# Patient Record
Sex: Female | Born: 1990 | Race: Black or African American | Hispanic: No | Marital: Single | Smoking: Current every day smoker
Health system: Southern US, Community
[De-identification: ages and names within clinical notes are randomized; demographics above are authoritative.]

## PROBLEM LIST (undated history)

## (undated) ENCOUNTER — Inpatient Hospital Stay (HOSPITAL_COMMUNITY): Payer: Self-pay

## (undated) DIAGNOSIS — Z789 Other specified health status: Secondary | ICD-10-CM

---

## 2010-01-03 HISTORY — PX: CHOLECYSTECTOMY: SHX55

## 2015-07-12 ENCOUNTER — Emergency Department (HOSPITAL_COMMUNITY)
Admission: EM | Admit: 2015-07-12 | Discharge: 2015-07-12 | Disposition: A | Discharge status: Home / Self Care | Payer: Medicaid Other | Attending: Emergency Medicine | Admitting: Emergency Medicine

## 2015-07-12 ENCOUNTER — Emergency Department (HOSPITAL_COMMUNITY): Payer: Medicaid Other

## 2015-07-12 ENCOUNTER — Encounter (HOSPITAL_COMMUNITY): Payer: Self-pay | Admitting: Emergency Medicine

## 2015-07-12 DIAGNOSIS — R1011 Right upper quadrant pain: Secondary | ICD-10-CM | POA: Insufficient documentation

## 2015-07-12 DIAGNOSIS — R1010 Upper abdominal pain, unspecified: Secondary | ICD-10-CM

## 2015-07-12 DIAGNOSIS — R1013 Epigastric pain: Secondary | ICD-10-CM | POA: Insufficient documentation

## 2015-07-12 DIAGNOSIS — F17210 Nicotine dependence, cigarettes, uncomplicated: Secondary | ICD-10-CM | POA: Insufficient documentation

## 2015-07-12 DIAGNOSIS — R112 Nausea with vomiting, unspecified: Secondary | ICD-10-CM | POA: Diagnosis not present

## 2015-07-12 LAB — CBC WITH DIFFERENTIAL/PLATELET
Basophils Absolute: 0 10*3/uL (ref 0.0–0.1)
Basophils Relative: 0 %
EOS ABS: 0.1 10*3/uL (ref 0.0–0.7)
EOS PCT: 1 %
HCT: 37 % (ref 36.0–46.0)
Hemoglobin: 13.1 g/dL (ref 12.0–15.0)
LYMPHS ABS: 2.4 10*3/uL (ref 0.7–4.0)
Lymphocytes Relative: 30 %
MCH: 30.8 pg (ref 26.0–34.0)
MCHC: 35.4 g/dL (ref 30.0–36.0)
MCV: 87.1 fL (ref 78.0–100.0)
Monocytes Absolute: 0.5 10*3/uL (ref 0.1–1.0)
Monocytes Relative: 7 %
NEUTROS ABS: 4.9 10*3/uL (ref 1.7–7.7)
NEUTROS PCT: 62 %
PLATELETS: 291 10*3/uL (ref 150–400)
RBC: 4.25 MIL/uL (ref 3.87–5.11)
RDW: 13 % (ref 11.5–15.5)
WBC: 7.9 10*3/uL (ref 4.0–10.5)

## 2015-07-12 LAB — COMPREHENSIVE METABOLIC PANEL
ALT: 11 U/L — ABNORMAL LOW (ref 14–54)
AST: 14 U/L — ABNORMAL LOW (ref 15–41)
Albumin: 4.1 g/dL (ref 3.5–5.0)
Alkaline Phosphatase: 62 U/L (ref 38–126)
Anion gap: 8 (ref 5–15)
BUN: <5 mg/dL — ABNORMAL LOW (ref 6–20)
CHLORIDE: 108 mmol/L (ref 101–111)
CO2: 22 mmol/L (ref 22–32)
Calcium: 9.4 mg/dL (ref 8.9–10.3)
Creatinine, Ser: 0.76 mg/dL (ref 0.44–1.00)
GFR calc non Af Amer: >60 mL/min (ref >60)
Glucose, Bld: 113 mg/dL — ABNORMAL HIGH (ref 65–99)
POTASSIUM: 3.1 mmol/L — AB (ref 3.5–5.1)
SODIUM: 138 mmol/L (ref 135–145)
Total Bilirubin: 0.7 mg/dL (ref 0.3–1.2)
Total Protein: 7.9 g/dL (ref 6.5–8.1)

## 2015-07-12 LAB — LIPASE, BLOOD: Lipase: 20 U/L (ref 11–51)

## 2015-07-12 LAB — I-STAT BETA HCG BLOOD, ED (MC, WL, AP ONLY)

## 2015-07-12 LAB — URINE MICROSCOPIC-ADD ON
Bacteria, UA: NONE SEEN
RBC / HPF: NONE SEEN RBC/hpf (ref 0–5)

## 2015-07-12 LAB — URINALYSIS, ROUTINE W REFLEX MICROSCOPIC
Bilirubin Urine: NEGATIVE
Glucose, UA: NEGATIVE mg/dL
Hgb urine dipstick: NEGATIVE
KETONES UR: NEGATIVE mg/dL
NITRITE: NEGATIVE
PROTEIN: NEGATIVE mg/dL
Specific Gravity, Urine: 1.025 (ref 1.005–1.030)
pH: 6 (ref 5.0–8.0)

## 2015-07-12 MED ORDER — KETOROLAC TROMETHAMINE 30 MG/ML IJ SOLN
30.0000 mg | Freq: Once | INTRAMUSCULAR | Status: AC
  Administered 2015-07-12: 30 mg via INTRAVENOUS
  Filled 2015-07-12: qty 1

## 2015-07-12 MED ORDER — HYDROMORPHONE HCL 1 MG/ML IJ SOLN
1.0000 mg | Freq: Once | INTRAMUSCULAR | Status: AC
  Administered 2015-07-12: 1 mg via INTRAMUSCULAR
  Filled 2015-07-12: qty 1

## 2015-07-12 MED ORDER — ONDANSETRON HCL 4 MG/2ML IJ SOLN
4.0000 mg | Freq: Once | INTRAMUSCULAR | Status: AC
  Administered 2015-07-12: 4 mg via INTRAVENOUS
  Filled 2015-07-12: qty 2

## 2015-07-12 MED ORDER — PROMETHAZINE HCL 25 MG PO TABS: 25.0000 mg | ORAL_TABLET | Freq: Four times a day (QID) | ORAL | Status: DC | PRN

## 2015-07-12 MED ORDER — SODIUM CHLORIDE 0.9 % IV BOLUS (SEPSIS)
1000.0000 mL | Freq: Once | INTRAVENOUS | Status: AC
  Administered 2015-07-12: 1000 mL via INTRAVENOUS

## 2015-07-12 MED ORDER — POTASSIUM CHLORIDE CRYS ER 20 MEQ PO TBCR
40.0000 meq | EXTENDED_RELEASE_TABLET | Freq: Once | ORAL | Status: AC
  Administered 2015-07-12: 40 meq via ORAL
  Filled 2015-07-12: qty 2

## 2015-07-12 MED ORDER — FAMOTIDINE IN NACL 20-0.9 MG/50ML-% IV SOLN
20.0000 mg | INTRAVENOUS | Status: AC
  Administered 2015-07-12: 20 mg via INTRAVENOUS
  Filled 2015-07-12: qty 50

## 2015-07-12 MED ORDER — OMEPRAZOLE 20 MG PO CPDR: 20.0000 mg | DELAYED_RELEASE_CAPSULE | Freq: Every day | ORAL | Status: DC

## 2015-07-12 MED ORDER — DICYCLOMINE HCL 20 MG PO TABS: 20.0000 mg | ORAL_TABLET | Freq: Two times a day (BID) | ORAL | Status: DC

## 2015-07-12 MED ORDER — IOPAMIDOL (ISOVUE-300) INJECTION 61%
100.0000 mL | Freq: Once | INTRAVENOUS | Status: AC | PRN
  Administered 2015-07-12: 100 mL via INTRAVENOUS

## 2015-07-12 NOTE — ED Provider Notes (Signed)
CSN: [id number]     Arrival date & time 07/12/15  [id number] History   First MD Initiated Contact with Patient 07/12/15 0320     Chief Complaint  Patient presents with  . Abdominal Pain  . Nausea  . Emesis     (Consider location/radiation/quality/duration/timing/severity/associated sxs/prior Treatment) HPI Comments: 25 year old female presents to the emergency department for evaluation of intermittent epigastric and right upper quadrant abdominal pain which began at 10 AM yesterday. She states that symptoms have been waxing and waning in severity. She has been unable to eat solid foods secondary to nausea and vomiting. She has been able to keep down a small amount of water. Patient has had a total of 4 episodes of nonbloody emesis. She denies the radiation of her abdominal pain and has not taken any medications for symptoms prior to arrival. She states that her pain feels similar to when she had gallstones. She reportedly [facility]. Patient denies any fevers, hematemesis, melena, hematochezia, vaginal complaints, dysuria, or hematuria.  Patient is a 25 y.o. female presenting with abdominal pain and vomiting. The history is provided by the patient. No language interpreter was used.  Abdominal Pain Associated symptoms: nausea and vomiting   Associated symptoms: no diarrhea, no dysuria, no fever, no vaginal bleeding and no vaginal discharge   Emesis Associated symptoms: abdominal pain   Associated symptoms: no diarrhea     History reviewed. No pertinent past medical history. History reviewed. No pertinent past surgical history. History reviewed. No pertinent family history. Social History  Substance Use Topics  . Smoking status: Current Every Day Smoker    Types: Cigarettes  . Smokeless tobacco: None  . Alcohol Use: No   OB History    No data available      Review of Systems  Constitutional: Negative for fever.  Gastrointestinal: Positive for nausea,  vomiting and abdominal pain. Negative for diarrhea.  Genitourinary: Negative for dysuria, vaginal bleeding and vaginal discharge.  All other systems reviewed and are negative.   Allergies  Review of patient's allergies indicates no known allergies.  Home Medications   Prior to Admission medications   Not on File   BP 125/56 mmHg  Pulse 69  Temp(Src) 99.1 F (37.3 C) (Oral)  Resp 18  Ht 5\' 4"  (1.626 m)  Wt 101.152 kg  BMI 38.26 kg/m2  SpO2 98%  LMP 06/14/2015   Physical Exam  Constitutional: She is oriented to person, place, and time. She appears well-developed and well-nourished. No distress.  Nontoxic appearing, but seems uncomfortable.  HENT:  Head: Normocephalic and atraumatic.  Eyes: Conjunctivae and EOM are normal. No scleral icterus.  Neck: Normal range of motion.  Cardiovascular: Normal rate, regular rhythm and intact distal pulses.   Pulmonary/Chest: Effort normal and breath sounds normal. No respiratory distress. She has no wheezes. She has no rales.  Respirations even and unlabored  Abdominal: There is tenderness. There is no rebound and no guarding.  Focal RUQ and epigastric TTP associated with voluntary guarding. Abdomen without rigidity. No masses. No peritoneal signs.  Musculoskeletal: Normal range of motion.  Neurological: She is alert and oriented to person, place, and time. She exhibits normal muscle tone. Coordination normal.  GCS 15. Patient moving all extremities.  Skin: Skin is warm and dry. No rash noted. She is not diaphoretic. No erythema. No pallor.  Psychiatric: She has a normal mood and affect. Her behavior is normal.  Nursing note and vitals reviewed.   ED  Course  Procedures (including critical care time) Labs Review Labs Reviewed  COMPREHENSIVE METABOLIC PANEL - Abnormal; Notable for the following:    Potassium 3.1 (*)    Glucose, Bld 113 (*)    BUN <5 (*)    AST 14 (*)    ALT 11 (*)    All other components within normal limits   URINALYSIS, ROUTINE W REFLEX MICROSCOPIC (NOT AT [facility]) - Abnormal; Notable for the following:    Color, Urine AMBER (*)    Leukocytes, UA SMALL (*)    All other components within normal limits  URINE MICROSCOPIC-ADD ON - Abnormal; Notable for the following:    Squamous Epithelial / LPF 0-5 (*)    All other components within normal limits  CBC WITH DIFFERENTIAL/PLATELET  LIPASE, BLOOD  I-STAT BETA HCG BLOOD, ED ([facility], [facility], [facility] ONLY)    Imaging Review Ct Abdomen Pelvis W Contrast  07/12/2015  CLINICAL DATA:  Acute onset of generalized abdominal pain, nausea and vomiting. Initial encounter. EXAM: CT ABDOMEN AND PELVIS WITH CONTRAST TECHNIQUE: Multidetector CT imaging of the abdomen and pelvis was performed using the standard protocol following bolus administration of intravenous contrast. CONTRAST:  ISOVUE-300 IOPAMIDOL (ISOVUE-300) INJECTION 61% COMPARISON:  None. FINDINGS: Minimal bibasilar atelectasis is noted. The liver and spleen are unremarkable in appearance. The gallbladder is not seen. The pancreas and adrenal glands are unremarkable. The kidneys are unremarkable in appearance. There is no evidence of hydronephrosis. No renal or ureteral stones are seen. No perinephric stranding is appreciated. No free fluid is identified. The small bowel is unremarkable in appearance. The stomach is within normal limits. No acute vascular abnormalities are seen. The appendix is normal in caliber, without evidence of appendicitis. The colon is grossly unremarkable in appearance. The bladder is mildly distended and grossly unremarkable. The uterus is unremarkable in appearance. The ovaries are grossly symmetric. No suspicious adnexal masses are seen. No inguinal lymphadenopathy is seen. No acute osseous abnormalities are identified. IMPRESSION: No acute abnormality seen within the abdomen or pelvis. Electronically Signed   By: Roanna Raider M.D.   On: 07/12/2015 06:02     I have personally reviewed and  evaluated these images and lab results as part of my medical decision-making.   EKG Interpretation None       4:43 AM Patient reassessed. She states that her pain continues to wax and wane despite medications. She has no complaints of nausea. She does continue to appear uncomfortable. Repeat examination still with tenderness in the epigastric abdomen and right upper quadrant. Plan to proceed with CT scan, given history of cholecystectomy, to further evaluate cause of pain. MDM   Final diagnoses:  Pain of upper abdomen  Non-intractable vomiting with nausea, vomiting of unspecified type    Patient presents for nausea and vomiting with upper abdominal pain x 1 day. Hx of cholecystectomy. Labs reassuring. No fever or leukocytosis. Lungs CTAB. Abdominal CT obtained given degree of tenderness on initial and repeat exam. CT negative for acute process.  Patient is feeling better on reassessment. She has been able to tolerate PO fluids without emesis. Suspect viral process vs food-borne illness. Supportive therapy indicated with return if symptoms worsen. Will also refer to gastroenterology. Return precautions discussed and provided. Patient discharged in satisfactory condition with no unaddressed concerns.   Filed Vitals:   07/12/15 0315 07/12/15 0317 07/12/15 0548 07/12/15 0628  BP: 125/56  115/56 103/64  Pulse: 69  61 71  Temp: 99.1 F (37.3 C)  97.7 F (  36.5 C)   TempSrc: Oral  Oral   Resp: 18  20 17   Height: 5\' 4"  (1.626 m)     Weight: 101.152 kg     SpO2: 100% 98% 100% 96%     Antony Madura, PA-C 07/13/15 0347  Azalia Bilis, MD 07/15/15 1020

## 2015-07-12 NOTE — Discharge Instructions (Signed)

## 2015-07-12 NOTE — ED Notes (Signed)
Per EMS pt. From home with complaint of abdominal pain at 10/10 with N/V started 10am yesterday. Last BM two days ago.

## 2016-02-19 ENCOUNTER — Inpatient Hospital Stay (HOSPITAL_COMMUNITY): Payer: Medicaid Other

## 2016-02-19 ENCOUNTER — Inpatient Hospital Stay (HOSPITAL_COMMUNITY)
Admission: AD | Admit: 2016-02-19 | Discharge: 2016-02-19 | Disposition: A | Discharge status: Home / Self Care | Payer: Medicaid Other | Source: Ambulatory Visit | Attending: Family Medicine | Admitting: Family Medicine

## 2016-02-19 ENCOUNTER — Encounter (HOSPITAL_COMMUNITY): Payer: Self-pay

## 2016-02-19 DIAGNOSIS — O3680X0 Pregnancy with inconclusive fetal viability, not applicable or unspecified: Secondary | ICD-10-CM | POA: Diagnosis not present

## 2016-02-19 DIAGNOSIS — Z3A01 Less than 8 weeks gestation of pregnancy: Secondary | ICD-10-CM | POA: Insufficient documentation

## 2016-02-19 DIAGNOSIS — O209 Hemorrhage in early pregnancy, unspecified: Secondary | ICD-10-CM | POA: Insufficient documentation

## 2016-02-19 DIAGNOSIS — O99331 Smoking (tobacco) complicating pregnancy, first trimester: Secondary | ICD-10-CM | POA: Insufficient documentation

## 2016-02-19 DIAGNOSIS — R109 Unspecified abdominal pain: Secondary | ICD-10-CM

## 2016-02-19 DIAGNOSIS — Z9049 Acquired absence of other specified parts of digestive tract: Secondary | ICD-10-CM | POA: Diagnosis not present

## 2016-02-19 DIAGNOSIS — O26899 Other specified pregnancy related conditions, unspecified trimester: Secondary | ICD-10-CM

## 2016-02-19 DIAGNOSIS — O4691 Antepartum hemorrhage, unspecified, first trimester: Secondary | ICD-10-CM

## 2016-02-19 DIAGNOSIS — Z3201 Encounter for pregnancy test, result positive: Secondary | ICD-10-CM | POA: Insufficient documentation

## 2016-02-19 DIAGNOSIS — R102 Pelvic and perineal pain: Secondary | ICD-10-CM | POA: Diagnosis present

## 2016-02-19 HISTORY — DX: Other specified health status: Z78.9

## 2016-02-19 LAB — CBC
HCT: 35 % — ABNORMAL LOW (ref 36.0–46.0)
Hemoglobin: 12.3 g/dL (ref 12.0–15.0)
MCH: 31.1 pg (ref 26.0–34.0)
MCHC: 35.1 g/dL (ref 30.0–36.0)
MCV: 88.6 fL (ref 78.0–100.0)
PLATELETS: 304 10*3/uL (ref 150–400)
RBC: 3.95 MIL/uL (ref 3.87–5.11)
RDW: 13.1 % (ref 11.5–15.5)
WBC: 7.5 10*3/uL (ref 4.0–10.5)

## 2016-02-19 LAB — URINALYSIS, ROUTINE W REFLEX MICROSCOPIC
BILIRUBIN URINE: NEGATIVE
Bacteria, UA: NONE SEEN
Glucose, UA: NEGATIVE mg/dL
Ketones, ur: NEGATIVE mg/dL
Nitrite: NEGATIVE
PH: 8 (ref 5.0–8.0)
Protein, ur: NEGATIVE mg/dL
Specific Gravity, Urine: 1.017 (ref 1.005–1.030)

## 2016-02-19 LAB — POCT PREGNANCY, URINE: Preg Test, Ur: POSITIVE — AB

## 2016-02-19 LAB — WET PREP, GENITAL
Clue Cells Wet Prep HPF POC: NONE SEEN
Trich, Wet Prep: NONE SEEN
YEAST WET PREP: NONE SEEN

## 2016-02-19 LAB — ABO/RH: ABO/RH(D): A POS

## 2016-02-19 LAB — HCG, QUANTITATIVE, PREGNANCY: hCG, Beta Chain, Quant, S: 118 m[IU]/mL — ABNORMAL HIGH (ref <5)

## 2016-02-19 MED ORDER — OXYCODONE-ACETAMINOPHEN 5-325 MG PO TABS
2.0000 | ORAL_TABLET | Freq: Once | ORAL | Status: AC
  Administered 2016-02-19: 2 via ORAL
  Filled 2016-02-19: qty 2

## 2016-02-19 NOTE — MAU Provider Note (Signed)
History     CSN: [id number]  Arrival date and time: 02/19/16 1033   First Provider Initiated Contact with Patient 02/19/16 1056       Chief Complaint  Patient presents with  . Abdominal Pain   HPI Michele Brady is a 26 y.o. [id number] at 1383w0d by LMP who presents via EMS with abdominal pain & vaginal spotting. Symptoms began this morning around 9am. Reports sudden onset lower abdominal pain that is sharp & intermittent. Pain started 2 hours after intercourse. Rates pain 8/10. Has not treated. Movement makes pain worse. Lying on her abdomen makes pain somewhat better. Noted pink/red spotting on toilet paper after pain started. Denies n/v/d, constipation, dysuria, fever, or vaginal discharge. Last BM was yesterday.   OB History    Gravida Para Term Preterm AB Living   5 2 2  0 2 2   SAB TAB Ectopic Multiple Live Births   1 1     2       Past Medical History:  Diagnosis Date  . Medical history non-contributory     Past Surgical History:  Procedure Laterality Date  . CHOLECYSTECTOMY  2012    No family history on file.  Social History  Substance Use Topics  . Smoking status: Current Every Day Smoker    Types: Cigarettes  . Smokeless tobacco: Never Used  . Alcohol use No    Allergies: No Known Allergies  Prescriptions Prior to Admission  Medication Sig Dispense Refill Last Dose  . dicyclomine (BENTYL) 20 MG tablet Take 1 tablet (20 mg total) by mouth 2 (two) times daily. 20 tablet 0   . omeprazole (PRILOSEC) 20 MG capsule Take 1 capsule (20 mg total) by mouth daily. 30 capsule 0   . promethazine (PHENERGAN) 25 MG tablet Take 1 tablet (25 mg total) by mouth every 6 (six) hours as needed for nausea or vomiting. 12 tablet 0     Review of Systems  Constitutional: Negative.   Gastrointestinal: Positive for abdominal pain. Negative for constipation, diarrhea, nausea and vomiting.  Genitourinary: Positive for vaginal bleeding. Negative for dyspareunia, dysuria and vaginal  discharge.   Physical Exam   Blood pressure 130/76, pulse 82, temperature 98.1 F (36.7 C), temperature source Oral, resp. rate 20, height 5\' 3"  (1.6 m), weight 212 lb (96.2 kg), last menstrual period 01/15/2016, SpO2 99 %.  Physical Exam  Nursing note and vitals reviewed. Constitutional: She is oriented to person, place, and time. She appears well-developed and well-nourished. No distress.  HENT:  Head: Normocephalic and atraumatic.  Eyes: Conjunctivae are normal. Right eye exhibits no discharge. Left eye exhibits no discharge. No scleral icterus.  Neck: Normal range of motion.  Cardiovascular: Normal rate, regular rhythm and normal heart sounds.   No murmur heard. Respiratory: Effort normal and breath sounds normal. No respiratory distress. She has no wheezes.  GI: Soft. Bowel sounds are normal. She exhibits no distension and no mass. There is tenderness in the right lower quadrant. There is no rebound and no guarding.  Genitourinary: Uterus normal. Cervix exhibits motion tenderness. Cervix exhibits no friability. Right adnexum displays tenderness. Right adnexum displays no mass. Left adnexum displays tenderness. Left adnexum displays no mass. There is bleeding (scant amount of dark red blood in canal; no active bleeding) in the vagina. No vaginal discharge found.  Genitourinary Comments: Cervix closed  Neurological: She is alert and oriented to person, place, and time.  Skin: Skin is warm and dry. She is not diaphoretic.  Psychiatric: She has a  normal mood and affect. Her behavior is normal. Judgment and thought content normal.    MAU Course  Procedures Results for orders placed or performed during the hospital encounter of 02/19/16 (from the past 24 hour(s))  Urinalysis, Routine w reflex microscopic     Status: Abnormal   Collection Time: 02/19/16 10:50 AM  Result Value Ref Range   Color, Urine YELLOW YELLOW   APPearance HAZY (A) CLEAR   Specific Gravity, Urine 1.017 1.005 -  1.030   pH 8.0 5.0 - 8.0   Glucose, UA NEGATIVE NEGATIVE mg/dL   Hgb urine dipstick MODERATE (A) NEGATIVE   Bilirubin Urine NEGATIVE NEGATIVE   Ketones, ur NEGATIVE NEGATIVE mg/dL   Protein, ur NEGATIVE NEGATIVE mg/dL   Nitrite NEGATIVE NEGATIVE   Leukocytes, UA TRACE (A) NEGATIVE   RBC / HPF 0-5 0 - 5 RBC/hpf   WBC, UA 6-30 0 - 5 WBC/hpf   Bacteria, UA NONE SEEN NONE SEEN   Squamous Epithelial / LPF 0-5 (A) NONE SEEN   Mucous PRESENT   Pregnancy, urine POC     Status: Abnormal   Collection Time: 02/19/16 10:53 AM  Result Value Ref Range   Preg Test, Ur POSITIVE (A) NEGATIVE  Wet prep, genital     Status: Abnormal   Collection Time: 02/19/16 11:09 AM  Result Value Ref Range   Yeast Wet Prep HPF POC NONE SEEN NONE SEEN   Trich, Wet Prep NONE SEEN NONE SEEN   Clue Cells Wet Prep HPF POC NONE SEEN NONE SEEN   WBC, Wet Prep HPF POC FEW (A) NONE SEEN   Sperm PRESENT   CBC     Status: Abnormal   Collection Time: 02/19/16 11:30 AM  Result Value Ref Range   WBC 7.5 4.0 - 10.5 K/uL   RBC 3.95 3.87 - 5.11 MIL/uL   Hemoglobin 12.3 12.0 - 15.0 g/dL   HCT 16.1 (L) 09.6 - 04.5 %   MCV 88.6 78.0 - 100.0 fL   MCH 31.1 26.0 - 34.0 pg   MCHC 35.1 30.0 - 36.0 g/dL   RDW 40.9 81.1 - 91.4 %   Platelets 304 150 - 400 K/uL  ABO/Rh     Status: None (Preliminary result)   Collection Time: 02/19/16 11:31 AM  Result Value Ref Range   ABO/RH(D) A POS   hCG, quantitative, pregnancy     Status: Abnormal   Collection Time: 02/19/16 11:31 AM  Result Value Ref Range   hCG, Beta Chain, Quant, S 118 (H) <5 mIU/mL   US Ob Comp Less 14 Wks  Result Date: 02/19/2016 CLINICAL DATA:  Pregnant, bleeding/cramping x1 day EXAM: OBSTETRIC <14 WK [place] AND TRANSVAGINAL OB US TECHNIQUE: Both transabdominal and transvaginal ultrasound examinations were performed for complete evaluation of the gestation as well as the maternal uterus, adnexal regions, and pelvic cul-de-sac. Transvaginal technique was performed to assess  early pregnancy. COMPARISON:  None. FINDINGS: Intrauterine gestational sac: None Yolk sac:  Not Visualized. Embryo:  Not Visualized. Subchorionic hemorrhage:  None visualized. Maternal uterus/adnexae: Endometrial complex measures 15 mm. Bilateral ovaries are within normal limits. Trace pelvic fluid. IMPRESSION: No IUP is visualized. By definition, in the setting of a positive pregnancy test, this reflects a pregnancy of unknown location. Differential considerations include early normal IUP, abnormal IUP/missed abortion, or nonvisualized ectopic pregnancy. Serial beta HCG is suggested. Consider repeat pelvic ultrasound in 14 days, as clinically warranted. Electronically Signed   By: Charline Bills M.D.   On: 02/19/2016 12:20  US Ob Transvaginal  Result Date: 02/19/2016 CLINICAL DATA:  Pregnant, bleeding/cramping x1 day EXAM: OBSTETRIC <14 WK [place] AND TRANSVAGINAL OB US TECHNIQUE: Both transabdominal and transvaginal ultrasound examinations were performed for complete evaluation of the gestation as well as the maternal uterus, adnexal regions, and pelvic cul-de-sac. Transvaginal technique was performed to assess early pregnancy. COMPARISON:  None. FINDINGS: Intrauterine gestational sac: None Yolk sac:  Not Visualized. Embryo:  Not Visualized. Subchorionic hemorrhage:  None visualized. Maternal uterus/adnexae: Endometrial complex measures 15 mm. Bilateral ovaries are within normal limits. Trace pelvic fluid. IMPRESSION: No IUP is visualized. By definition, in the setting of a positive pregnancy test, this reflects a pregnancy of unknown location. Differential considerations include early normal IUP, abnormal IUP/missed abortion, or nonvisualized ectopic pregnancy. Serial beta HCG is suggested. Consider repeat pelvic ultrasound in 14 days, as clinically warranted. Electronically Signed   By: Charline Bills M.D.   On: 02/19/2016 12:20    MDM +UPT UA, wet prep, GC/chlamydia, CBC, ABO/Rh, quant hCG, HIV, and  [place] today to rule out ectopic pregnancy A positive + CMT -- no WBCs nor purulent cervical discharge Ultrasound shows no SIUP or adnexal mass -- BHCG 118 Percocet 2 tabs PO -- pt reports improvement  This abdominal pain & vaginal bleeding could represent a normal pregnancy, spontaneous abortion, or even an ectopic pregnancy which can be life-threatening. Cultures were obtained to rule out pelvic infection.  Assessment and Plan  A: 1. Pregnancy of unknown anatomic location   2. Vaginal bleeding in pregnancy, first trimester   3. Abdominal pain affecting pregnancy    P: Discharge home in stable condition Return to MAU Sunday afternoon for repeat BHCG or sooner if symptoms worsen -- discussed s/s of ectopic pregnancy GC/CT pending Take tylenol prn pain  Judeth Horn 02/19/2016, 10:55 AM

## 2016-02-19 NOTE — Discharge Instructions (Signed)
Return to care  °· If you have heavier bleeding that soaks through more that 2 pads per hour for an hour or more °· If you bleed so much that you feel like you might pass out or you do pass out °· If you have significant abdominal pain that is not improved with Tylenol  °· If you develop a fever > 100.5 ° ° ° ° ° °Abdominal Pain During Pregnancy °Belly (abdominal) pain is common during pregnancy. Most of the time, it is not a serious problem. Other times, it can be a sign that something is wrong with the pregnancy. Always tell your doctor if you have belly pain. °Follow these instructions at home: °Monitor your belly pain for any changes. The following actions may help you feel better: °· Do not have sex (intercourse) or put anything in your vagina until you feel better. °· Rest until your pain stops. °· Drink clear fluids if you feel sick to your stomach (nauseous). Do not eat solid food until you feel better. °· Only take medicine as told by your doctor. °· Keep all doctor visits as told. °Get help right away if: °· You are bleeding, leaking fluid, or pieces of tissue come out of your vagina. °· You have more pain or cramping. °· You keep throwing up (vomiting). °· You have pain when you pee (urinate) or have blood in your pee. °· You have a fever. °· You do not feel your baby moving as much. °· You feel very weak or feel like passing out. °· You have trouble breathing, with or without belly pain. °· You have a very bad headache and belly pain. °· You have fluid leaking from your vagina and belly pain. °· You keep having watery poop (diarrhea). °· Your belly pain does not go away after resting, or the pain gets worse. °This information is not intended to replace advice given to you by your health care provider. Make sure you discuss any questions you have with your health care provider. °Document Released: 12/08/2008 Document Revised: 07/29/2015 Document Reviewed: 07/19/2012 °[employer]vier Interactive Patient Education  © 2017 [employer] ° °

## 2016-02-19 NOTE — MAU Note (Signed)
Pt states her last period was on January 12th. Pt states she took a pregnancy test on the 13th of this month and it was positive. Pt states this morning she woke up around 9 with bad stomach cramps. Pt states she had some reddish pink spotting also today.

## 2016-02-20 LAB — HIV ANTIBODY (ROUTINE TESTING W REFLEX): HIV Screen 4th Generation wRfx: NONREACTIVE

## 2016-02-22 LAB — GC/CHLAMYDIA PROBE AMP (~~LOC~~) NOT AT ARMC
CHLAMYDIA, DNA PROBE: POSITIVE — AB
NEISSERIA GONORRHEA: NEGATIVE

## 2016-02-23 ENCOUNTER — Telehealth (HOSPITAL_COMMUNITY): Payer: Self-pay | Admitting: *Deleted

## 2016-02-23 ENCOUNTER — Other Ambulatory Visit: Payer: Self-pay | Admitting: Medical

## 2016-02-23 DIAGNOSIS — O98811 Other maternal infectious and parasitic diseases complicating pregnancy, first trimester: Principal | ICD-10-CM

## 2016-02-23 DIAGNOSIS — A749 Chlamydial infection, unspecified: Secondary | ICD-10-CM

## 2016-02-23 MED ORDER — AZITHROMYCIN 250 MG PO TABS: 1000.0000 mg | ORAL_TABLET | Freq: Once | ORAL | 0 refills | Status: AC

## 2016-02-23 NOTE — Telephone Encounter (Signed)

## 2016-02-25 ENCOUNTER — Inpatient Hospital Stay (HOSPITAL_COMMUNITY): Payer: Medicaid Other

## 2016-02-25 ENCOUNTER — Encounter (HOSPITAL_COMMUNITY): Payer: Self-pay | Admitting: *Deleted

## 2016-02-25 ENCOUNTER — Inpatient Hospital Stay (HOSPITAL_COMMUNITY)
Admission: AD | Admit: 2016-02-25 | Discharge: 2016-02-25 | Disposition: A | Discharge status: Home / Self Care | Payer: Medicaid Other | Source: Ambulatory Visit | Attending: Obstetrics and Gynecology | Admitting: Obstetrics and Gynecology

## 2016-02-25 DIAGNOSIS — A084 Viral intestinal infection, unspecified: Secondary | ICD-10-CM

## 2016-02-25 DIAGNOSIS — Z3A01 Less than 8 weeks gestation of pregnancy: Secondary | ICD-10-CM | POA: Diagnosis not present

## 2016-02-25 DIAGNOSIS — F17210 Nicotine dependence, cigarettes, uncomplicated: Secondary | ICD-10-CM | POA: Insufficient documentation

## 2016-02-25 DIAGNOSIS — O26899 Other specified pregnancy related conditions, unspecified trimester: Secondary | ICD-10-CM

## 2016-02-25 DIAGNOSIS — O99611 Diseases of the digestive system complicating pregnancy, first trimester: Secondary | ICD-10-CM | POA: Insufficient documentation

## 2016-02-25 DIAGNOSIS — R109 Unspecified abdominal pain: Secondary | ICD-10-CM | POA: Insufficient documentation

## 2016-02-25 DIAGNOSIS — O99331 Smoking (tobacco) complicating pregnancy, first trimester: Secondary | ICD-10-CM | POA: Diagnosis not present

## 2016-02-25 DIAGNOSIS — O3680X0 Pregnancy with inconclusive fetal viability, not applicable or unspecified: Secondary | ICD-10-CM

## 2016-02-25 LAB — COMPREHENSIVE METABOLIC PANEL
ALBUMIN: 3.9 g/dL (ref 3.5–5.0)
ALK PHOS: 56 U/L (ref 38–126)
ALT: 9 U/L — ABNORMAL LOW (ref 14–54)
ANION GAP: 8 (ref 5–15)
AST: 15 U/L (ref 15–41)
BUN: 8 mg/dL (ref 6–20)
CALCIUM: 9 mg/dL (ref 8.9–10.3)
CHLORIDE: 103 mmol/L (ref 101–111)
CO2: 24 mmol/L (ref 22–32)
Creatinine, Ser: 0.58 mg/dL (ref 0.44–1.00)
GFR calc non Af Amer: >60 mL/min (ref >60)
GLUCOSE: 117 mg/dL — AB (ref 65–99)
POTASSIUM: 3.7 mmol/L (ref 3.5–5.1)
SODIUM: 135 mmol/L (ref 135–145)
Total Bilirubin: 0.6 mg/dL (ref 0.3–1.2)
Total Protein: 7.4 g/dL (ref 6.5–8.1)

## 2016-02-25 LAB — CBC
HEMATOCRIT: 35.3 % — AB (ref 36.0–46.0)
Hemoglobin: 12.4 g/dL (ref 12.0–15.0)
MCH: 31.2 pg (ref 26.0–34.0)
MCHC: 35.1 g/dL (ref 30.0–36.0)
MCV: 88.7 fL (ref 78.0–100.0)
PLATELETS: 300 10*3/uL (ref 150–400)
RBC: 3.98 MIL/uL (ref 3.87–5.11)
RDW: 13.2 % (ref 11.5–15.5)
WBC: 8.5 10*3/uL (ref 4.0–10.5)

## 2016-02-25 LAB — URINALYSIS, ROUTINE W REFLEX MICROSCOPIC
Bilirubin Urine: NEGATIVE
Glucose, UA: NEGATIVE mg/dL
Ketones, ur: NEGATIVE mg/dL
Nitrite: NEGATIVE
Protein, ur: NEGATIVE mg/dL
SPECIFIC GRAVITY, URINE: 1.01 (ref 1.005–1.030)
pH: 7 (ref 5.0–8.0)

## 2016-02-25 LAB — HCG, QUANTITATIVE, PREGNANCY: HCG, BETA CHAIN, QUANT, S: 460 m[IU]/mL — AB (ref <5)

## 2016-02-25 LAB — URINALYSIS, MICROSCOPIC (REFLEX)

## 2016-02-25 MED ORDER — LACTATED RINGERS IV SOLN: INTRAVENOUS | Status: DC

## 2016-02-25 MED ORDER — PROMETHAZINE HCL 25 MG PO TABS: 12.5000 mg | ORAL_TABLET | Freq: Four times a day (QID) | ORAL | 0 refills | Status: AC | PRN

## 2016-02-25 NOTE — MAU Provider Note (Signed)
History     CSN: [id number]  Arrival date and time: 02/25/16 4098   First Provider Initiated Contact with Patient 02/25/16 2005      Chief Complaint  Patient presents with  . Emesis  . Diarrhea   Emesis   This is a new problem. The current episode started in the past 7 days. The problem occurs less than 2 times per day. The problem has been unchanged. The emesis has an appearance of stomach contents. There has been no fever. Associated symptoms include abdominal pain and diarrhea. Pertinent negatives include no chills or fever. Risk factors: pregnancy  She has tried nothing for the symptoms.  Diarrhea   This is a new problem. The current episode started today. The problem occurs 5 to 10 times per day. The problem has been unchanged. The stool consistency is described as watery. The patient states that diarrhea does not awaken her from sleep. Associated symptoms include abdominal pain and vomiting. Pertinent negatives include no chills or fever. Associated symptoms comments: 8/10 abdominal pain. . Nothing aggravates the symptoms. Risk factors include recent antibiotic use (2g azithromycin yesterday ). She has tried nothing for the symptoms.    Past Medical History:  Diagnosis Date  . Medical history non-contributory     Past Surgical History:  Procedure Laterality Date  . CHOLECYSTECTOMY  2012    History reviewed. No pertinent family history.  Social History  Substance Use Topics  . Smoking status: Current Every Day Smoker    Packs/day: 0.50    Types: Cigarettes  . Smokeless tobacco: Never Used  . Alcohol use No    Allergies: No Known Allergies  No prescriptions prior to admission.    Review of Systems  Constitutional: Negative for chills and fever.  Gastrointestinal: Positive for abdominal pain, diarrhea and vomiting.  Genitourinary: Negative for pelvic pain, vaginal bleeding and vaginal discharge.   Physical Exam   Blood pressure 130/61, pulse 75, temperature  98.9 F (37.2 C), temperature source Oral, resp. rate 18, last menstrual period 01/15/2016, SpO2 100 %.  Physical Exam  Nursing note and vitals reviewed. Constitutional: She is oriented to person, place, and time. She appears well-developed and well-nourished. No distress.  HENT:  Head: Normocephalic.  Cardiovascular: Normal rate.   Respiratory: Effort normal.  GI: Soft. There is tenderness. There is no rebound.  Neurological: She is alert and oriented to person, place, and time.  Skin: Skin is warm and dry.  Psychiatric: She has a normal mood and affect.   Results for orders placed or performed during the hospital encounter of 02/25/16 (from the past 24 hour(s))  Urinalysis, Routine w reflex microscopic     Status: Abnormal   Collection Time: 02/25/16  8:30 PM  Result Value Ref Range   Color, Urine YELLOW YELLOW   APPearance CLEAR CLEAR   Specific Gravity, Urine 1.010 1.005 - 1.030   pH 7.0 5.0 - 8.0   Glucose, UA NEGATIVE NEGATIVE mg/dL   Hgb urine dipstick MODERATE (A) NEGATIVE   Bilirubin Urine NEGATIVE NEGATIVE   Ketones, ur NEGATIVE NEGATIVE mg/dL   Protein, ur NEGATIVE NEGATIVE mg/dL   Nitrite NEGATIVE NEGATIVE   Leukocytes, UA SMALL (A) NEGATIVE  Urinalysis, Microscopic (reflex)     Status: Abnormal   Collection Time: 02/25/16  8:30 PM  Result Value Ref Range   RBC / HPF 0-5 0 - 5 RBC/hpf   WBC, UA 0-5 0 - 5 WBC/hpf   Bacteria, UA FEW (A) NONE SEEN   Squamous Epithelial /  LPF 0-5 (A) NONE SEEN  CBC     Status: Abnormal   Collection Time: 02/25/16 10:01 PM  Result Value Ref Range   WBC 8.5 4.0 - 10.5 K/uL   RBC 3.98 3.87 - 5.11 MIL/uL   Hemoglobin 12.4 12.0 - 15.0 g/dL   HCT 16.135.3 (L) 09.636.0 - 04.546.0 %   MCV 88.7 78.0 - 100.0 fL   MCH 31.2 26.0 - 34.0 pg   MCHC 35.1 30.0 - 36.0 g/dL   RDW 40.913.2 81.111.5 - 91.415.5 %   Platelets 300 150 - 400 K/uL   [place] Ob Comp Less 14 Wks  Result Date: 02/25/2016 CLINICAL DATA:  Cramping and bleeding. Estimated gestational age by last  menstrual period equals 5.0 days EXAM: OBSTETRIC <14 WK US AND TRANSVAGINAL OB US TECHNIQUE: Both transabdominal and transvaginal ultrasound examinations were performed for complete evaluation of the gestation as well as the maternal uterus, adnexal regions, and pelvic cul-de-sac. Transvaginal technique was performed to assess early pregnancy. COMPARISON:  None. FINDINGS: Intrauterine gestational sac: Not identified Yolk sac:  Absent Embryo:  ABSENT Subchorionic hemorrhage:  None visualized. Maternal uterus/adnexae: Normal uterus. NORMAL OVARIES. TRACE FREE FLUID. IMPRESSION: No intrauterine gestational sac, yolk sac, or fetal pole identified. Differential considerations include intrauterine pregnancy too early to be sonographically visualized, missed abortion, or ectopic pregnancy. Followup ultrasound is recommended in 10-14 days for further evaluation. Electronically Signed   By: Genevive BiStewart  Edmunds M.D.   On: 02/25/2016 21:32   [place] Ob Transvaginal  Result Date: 02/25/2016 CLINICAL DATA:  Cramping and bleeding. Estimated gestational age by last menstrual period equals 5.0 days EXAM: OBSTETRIC <14 WK US AND TRANSVAGINAL OB US TECHNIQUE: Both transabdominal and transvaginal ultrasound examinations were performed for complete evaluation of the gestation as well as the maternal uterus, adnexal regions, and pelvic cul-de-sac. Transvaginal technique was performed to assess early pregnancy. COMPARISON:  None. FINDINGS: Intrauterine gestational sac: Not identified Yolk sac:  Absent Embryo:  ABSENT Subchorionic hemorrhage:  None visualized. Maternal uterus/adnexae: Normal uterus. NORMAL OVARIES. TRACE FREE FLUID. IMPRESSION: No intrauterine gestational sac, yolk sac, or fetal pole identified. Differential considerations include intrauterine pregnancy too early to be sonographically visualized, missed abortion, or ectopic pregnancy. Followup ultrasound is recommended in 10-14 days for further evaluation. Electronically  Signed   By: Genevive BiStewart  Edmunds M.D.   On: 02/25/2016 21:32    MAU Course  Procedures  MDM   Assessment and Plan   1. Abdominal pain in pregnancy   2. Viral gastroenteritis   3. Pregnancy, location unknown    DC home Comfort measures reviewed Bleeding precautions Ectopic precautions RX: phenergan PRN #[id number]  Return to MAU as needed FU with OB as planned  Follow-up Information    [facility] Follow up.   Why:  SATURDAY AFTERNOON FOR REPEAT BLOODWORK  Contact information: [address] [id number] [place]c [place] [id number] [phone]           Tawnya CrookHogan, Geza Beranek Donovan 02/25/2016, 8:08 PM

## 2016-02-25 NOTE — Discharge Instructions (Signed)
Ectopic Pregnancy °An ectopic pregnancy is when the fertilized egg attaches (implants) outside the uterus. Most ectopic pregnancies occur in one of the tubes where eggs travel from the ovary to the uterus (fallopian tubes), but the implanting can occur in other locations. In rare cases, ectopic pregnancies occur on the ovary, intestine, pelvis, abdomen, or cervix. In an ectopic pregnancy, the fertilized egg does not have the ability to develop into a normal, healthy baby. °A ruptured ectopic pregnancy is one in which tearing or bursting of a fallopian tube causes internal bleeding. Often, there is intense lower abdominal pain, and vaginal bleeding sometimes occurs. Having an ectopic pregnancy can be life-threatening. If this dangerous condition is not treated, it can lead to blood loss, shock, or even death. °What are the causes? °The most common cause of this condition is damage to one of the fallopian tubes. A fallopian tube may be narrowed or blocked, and that keeps the fertilized egg from reaching the uterus. °What increases the risk? °This condition is more likely to develop in women of childbearing age who have different levels of risk. The levels of risk can be divided into three categories. °High risk  °· You have gone through infertility treatment. °· You have had an ectopic pregnancy before. °· You have had surgery on the fallopian tubes, or another surgical procedure, such as an abortion. °· You have had surgery to have the fallopian tubes tied (tubal ligation). °· You have problems or diseases of the fallopian tubes. °· You have been exposed to diethylstilbestrol (DES). This medicine was used until 1971, and it had effects on babies whose mothers took the medicine. °· You become pregnant while using an IUD (intrauterine device) for birth control. °Moderate risk  °· You have a history of infertility. °· You have had an STI (sexually transmitted infection). °· You have a history of pelvic inflammatory  disease (PID). °· You have scarring from endometriosis. °· You have multiple sexual partners. °· You smoke. °Low risk  °· You have had pelvic surgery. °· You use vaginal douches. °· You became sexually active before age 18. °What are the signs or symptoms? °Common symptoms of this condition include normal pregnancy symptoms, such as missing a period, nausea, tiredness, abdominal pain, breast tenderness, and bleeding. However, ectopic pregnancy will have additional symptoms, such as: °· Pain with intercourse. °· Irregular vaginal bleeding or spotting. °· Cramping or pain on one side or in the lower abdomen. °· Fast heartbeat, low blood pressure, and sweating. °· Passing out while having a bowel movement. °Symptoms of a ruptured ectopic pregnancy and internal bleeding may include: °· Sudden, severe pain in the abdomen and pelvis. °· Dizziness, weakness, light-headedness, or fainting. °· Pain in the shoulder or neck area. °How is this diagnosed? °This condition is diagnosed by: °· A pelvic exam to locate pain or a mass in the abdomen. °· A pregnancy test. This blood test checks for the presence as well as the specific level of pregnancy hormone in the bloodstream. °· Ultrasound. This is performed if a pregnancy test is positive. In this test, a probe is inserted into the vagina. The probe will detect a fetus, possibly in a location other than the uterus. °· Taking a sample of uterus tissue (dilation and curettage, or D&C). °· Surgery to perform a visual exam of the inside of the abdomen using a thin, lighted tube that has a tiny camera on the end (laparoscope). °· Culdocentesis. This procedure involves inserting a needle at the   top of the vagina, behind the uterus. If blood is present in this area, it may indicate that a fallopian tube is torn. °How is this treated? °This condition is treated with medicine or surgery. °Medicine  °· An injection of a medicine (methotrexate) may be given to cause the pregnancy tissue to  be absorbed. This medicine may save your fallopian tube. It may be given if: °¨ The diagnosis is made early, with no signs of active bleeding. °¨ The fallopian tube has not ruptured. °¨ You are considered to be a good candidate for the medicine. °Usually, pregnancy hormone blood levels are checked after methotrexate treatment. This is to be sure that the medicine is effective. It may take 4-6 weeks for the pregnancy to be absorbed. Most pregnancies will be absorbed by 3 weeks. °Surgery  °· A laparoscope may be used to remove the pregnancy tissue. °· If severe internal bleeding occurs, a larger cut (incision) may be made in the lower abdomen (laparotomy) to remove the fetus and placenta. This is done to stop the bleeding. °· Part or all of the fallopian tube may be removed (salpingectomy) along with the fetus and placenta. The fallopian tube may also be repaired during the surgery. °· In very rare circumstances, removal of the uterus (hysterectomy) may be required. °· After surgery, pregnancy hormone testing may be done to be sure that there is no pregnancy tissue left. °Whether your treatment is medicine or surgery, you may receive a Rho (D) immune globulin shot to prevent problems with any future pregnancy. This shot may be given if: °· You are Rh-negative and the baby's father is Rh-positive. °· You are Rh-negative and you do not know the Rh type of the baby's father. °Follow these instructions at home: °· Rest and limit your activity after the procedure for as long as told by your health care provider. °· Until your health care provider says that it is safe: °¨ Do not lift anything that is heavier than 10 lb (4.5 kg), or the limit that your health care provider tells you. °¨ Avoid physical exercise and any movement that requires effort (is strenuous). °· To help prevent constipation: °¨ Eat a healthy diet that includes fruits, vegetables, and whole grains. °¨ Drink 6-8 glasses of water per day. °Get help right  away if: °· You develop worsening pain that is not relieved by medicine. °· You have: °¨ A fever or chills. °¨ Vaginal bleeding. °¨ Redness and swelling at the incision site. °¨ Nausea and vomiting. °· You feel dizzy or weak. °· You feel light-headed or you faint. °This information is not intended to replace advice given to you by your health care provider. Make sure you discuss any questions you have with your health care provider. °Document Released: 01/28/2004 Document Revised: 08/19/2015 Document Reviewed: 07/22/2015 °Elsevier Interactive Patient Education © 2017 [employer] ° °

## 2016-02-25 NOTE — MAU Note (Signed)
Pt. Here with c/o lower abd. pain, 8/10.  Pain is constant, pt. Denies vaginal bleeding. Pt. C/o N/V/D x5.

## 2016-02-29 ENCOUNTER — Inpatient Hospital Stay (HOSPITAL_COMMUNITY)
Admission: AD | Admit: 2016-02-29 | Discharge: 2016-02-29 | Disposition: A | Discharge status: Home / Self Care | Payer: Medicaid Other | Source: Ambulatory Visit | Attending: Obstetrics and Gynecology | Admitting: Obstetrics and Gynecology

## 2016-02-29 DIAGNOSIS — O9989 Other specified diseases and conditions complicating pregnancy, childbirth and the puerperium: Secondary | ICD-10-CM

## 2016-02-29 DIAGNOSIS — O209 Hemorrhage in early pregnancy, unspecified: Secondary | ICD-10-CM | POA: Diagnosis not present

## 2016-02-29 DIAGNOSIS — Z3A01 Less than 8 weeks gestation of pregnancy: Secondary | ICD-10-CM | POA: Insufficient documentation

## 2016-02-29 DIAGNOSIS — O3680X0 Pregnancy with inconclusive fetal viability, not applicable or unspecified: Secondary | ICD-10-CM

## 2016-02-29 LAB — HCG, QUANTITATIVE, PREGNANCY: hCG, Beta Chain, Quant, S: 651 m[IU]/mL — ABNORMAL HIGH (ref <5)

## 2016-02-29 NOTE — MAU Note (Signed)
missed f/u appt in clinic.  Bleeding became heavier this morning. Soaked through underwear, did not have a pad on.  Passing some clots.  Denies any pain

## 2016-02-29 NOTE — MAU Provider Note (Signed)
S:  Michele Brady is a 26 y.o. female [id number] @ 7129w3d here in MAU with vaginal bleeding. She was initially seen on 2/22 with bleeding and N/V. She was supposed to follow up here on Saturday for a repeat quant, however she was unable to get a ride here. She continues to have bleeding like a menstrual cycle. She denies pain at this time. Patient says her bleeding increased slightly this Am.   Beta hcg 2/22: 460 Beta hcg 2/26: 651 A positive blood type    O:  GENERAL: Well-developed, well-nourished female in no acute distress.  LUNGS: Effort normal SKIN: Warm, dry and without erythema PSYCH: Normal mood and affect  Vitals:   02/29/16 0826  BP: (!) 128/46  Pulse: 78  Resp: 18  Temp: 98.5 F (36.9 C)    Results for orders placed or performed during the hospital encounter of 02/29/16 (from the past 48 hour(s))  hCG, quantitative, pregnancy     Status: Abnormal   Collection Time: 02/29/16  8:27 AM  Result Value Ref Range   hCG, Beta Chain, Quant, S 651 (H) <5 mIU/mL    Comment:          GEST. AGE      CONC.  (mIU/mL)   <=1 WEEK        5 - 50     2 WEEKS       50 - 500     3 WEEKS       100 - 10,000     4 WEEKS     1,000 - 30,000     5 WEEKS     3,500 - 115,000   6-8 WEEKS     12,000 - 270,000    12 WEEKS     15,000 - 220,000        FEMALE AND NON-PREGNANT FEMALE:     LESS THAN 5 mIU/mL       A:  1. Pregnancy of unknown anatomic location   2. Vaginal bleeding in pregnancy, first trimester     P:  Discharge home in stable condition Ectopic precautions Pelvic rest Return to the [facility] on 2/28 for stat beta hcg level. Sticker placed in the yellow book for appointment Return to MAU if symptoms worsen Bleeding precautions  Duane LopeJennifer I Rasch, NP 02/29/2016 9:29 AM

## 2016-02-29 NOTE — Discharge Instructions (Signed)
Threatened Miscarriage °A threatened miscarriage is when you have vaginal bleeding during your first 20 weeks of pregnancy but the pregnancy has not ended. Your doctor will do tests to make sure you are still pregnant. The cause of the bleeding may not be known. This condition does not mean your pregnancy will end. It does increase the risk of it ending (complete miscarriage). °Follow these instructions at home: °· Make sure you keep all your doctor visits for prenatal care. °· Get plenty of rest. °· Do not have sex or use tampons if you have vaginal bleeding. °· Do not douche. °· Do not smoke or use drugs. °· Do not drink alcohol. °· Avoid caffeine. °Contact a doctor if: °· You have light bleeding from your vagina. °· You have belly pain or cramping. °· You have a fever. °Get help right away if: °· You have heavy bleeding from your vagina. °· You have clots of blood coming from your vagina. °· You have bad pain or cramps in your low back or belly. °· You have fever, chills, and bad belly pain. °This information is not intended to replace advice given to you by your health care provider. Make sure you discuss any questions you have with your health care provider. °Document Released: 12/03/2007 Document Revised: 05/28/2015 Document Reviewed: 10/16/2012 °Elsevier Interactive Patient Education © 2017 [employer] ° °

## 2016-02-29 NOTE — MAU Note (Signed)
Urine in lab 

## 2016-03-01 ENCOUNTER — Inpatient Hospital Stay (HOSPITAL_COMMUNITY)
Admission: AD | Admit: 2016-03-01 | Discharge: 2016-03-01 | Disposition: A | Discharge status: Home / Self Care | Payer: Medicaid Other | Source: Ambulatory Visit | Attending: Obstetrics and Gynecology | Admitting: Obstetrics and Gynecology

## 2016-03-01 ENCOUNTER — Encounter (HOSPITAL_COMMUNITY): Payer: Self-pay

## 2016-03-01 DIAGNOSIS — O9989 Other specified diseases and conditions complicating pregnancy, childbirth and the puerperium: Secondary | ICD-10-CM | POA: Diagnosis not present

## 2016-03-01 DIAGNOSIS — R109 Unspecified abdominal pain: Secondary | ICD-10-CM | POA: Insufficient documentation

## 2016-03-01 DIAGNOSIS — O99331 Smoking (tobacco) complicating pregnancy, first trimester: Secondary | ICD-10-CM | POA: Diagnosis not present

## 2016-03-01 DIAGNOSIS — Z3A01 Less than 8 weeks gestation of pregnancy: Secondary | ICD-10-CM | POA: Insufficient documentation

## 2016-03-01 DIAGNOSIS — K529 Noninfective gastroenteritis and colitis, unspecified: Secondary | ICD-10-CM | POA: Diagnosis not present

## 2016-03-01 DIAGNOSIS — F17210 Nicotine dependence, cigarettes, uncomplicated: Secondary | ICD-10-CM | POA: Diagnosis not present

## 2016-03-01 DIAGNOSIS — O210 Mild hyperemesis gravidarum: Secondary | ICD-10-CM | POA: Diagnosis present

## 2016-03-01 LAB — CBC WITH DIFFERENTIAL/PLATELET
BASOS ABS: 0 10*3/uL (ref 0.0–0.1)
BASOS PCT: 0 %
Eosinophils Absolute: 0.1 10*3/uL (ref 0.0–0.7)
Eosinophils Relative: 1 %
HEMATOCRIT: 36.8 % (ref 36.0–46.0)
Hemoglobin: 12.9 g/dL (ref 12.0–15.0)
LYMPHS PCT: 28 %
Lymphs Abs: 2.1 10*3/uL (ref 0.7–4.0)
MCH: 31.6 pg (ref 26.0–34.0)
MCHC: 35.1 g/dL (ref 30.0–36.0)
MCV: 90.2 fL (ref 78.0–100.0)
Monocytes Absolute: 0.5 10*3/uL (ref 0.1–1.0)
Monocytes Relative: 7 %
NEUTROS ABS: 4.7 10*3/uL (ref 1.7–7.7)
NEUTROS PCT: 64 %
Platelets: 291 10*3/uL (ref 150–400)
RBC: 4.08 MIL/uL (ref 3.87–5.11)
RDW: 13 % (ref 11.5–15.5)
WBC: 7.3 10*3/uL (ref 4.0–10.5)

## 2016-03-01 LAB — COMPREHENSIVE METABOLIC PANEL
ALBUMIN: 3.8 g/dL (ref 3.5–5.0)
ALT: 12 U/L — AB (ref 14–54)
AST: 17 U/L (ref 15–41)
Alkaline Phosphatase: 58 U/L (ref 38–126)
Anion gap: 11 (ref 5–15)
BILIRUBIN TOTAL: 0.4 mg/dL (ref 0.3–1.2)
BUN: 8 mg/dL (ref 6–20)
CO2: 20 mmol/L — ABNORMAL LOW (ref 22–32)
CREATININE: 0.72 mg/dL (ref 0.44–1.00)
Calcium: 9.2 mg/dL (ref 8.9–10.3)
Chloride: 105 mmol/L (ref 101–111)
GFR calc Af Amer: >60 mL/min (ref >60)
GFR calc non Af Amer: >60 mL/min (ref >60)
Glucose, Bld: 134 mg/dL — ABNORMAL HIGH (ref 65–99)
POTASSIUM: 3.7 mmol/L (ref 3.5–5.1)
Sodium: 136 mmol/L (ref 135–145)
TOTAL PROTEIN: 7 g/dL (ref 6.5–8.1)

## 2016-03-01 LAB — LIPASE, BLOOD: Lipase: 21 U/L (ref 11–51)

## 2016-03-01 LAB — AMYLASE: Amylase: 72 U/L (ref 28–100)

## 2016-03-01 LAB — HCG, QUANTITATIVE, PREGNANCY: hCG, Beta Chain, Quant, S: 860 m[IU]/mL — ABNORMAL HIGH (ref <5)

## 2016-03-01 MED ORDER — PROMETHAZINE HCL 25 MG/ML IJ SOLN
25.0000 mg | Freq: Once | INTRAMUSCULAR | Status: AC
  Administered 2016-03-01: 25 mg via INTRAVENOUS
  Filled 2016-03-01: qty 1

## 2016-03-01 MED ORDER — SODIUM CHLORIDE 0.9 % IV SOLN
Freq: Once | INTRAVENOUS | Status: AC
  Administered 2016-03-01: 1000 mL via INTRAVENOUS

## 2016-03-01 NOTE — MAU Note (Signed)
Received pt from EMS with nausea, vomiting, diarrhea for past few hours.

## 2016-03-01 NOTE — Discharge Instructions (Signed)

## 2016-03-01 NOTE — MAU Provider Note (Signed)
Chief Complaint: No chief complaint on file.   First Provider Initiated Contact with Patient 03/01/16 0435        SUBJECTIVE HPI: Michele Brady is a 26 y.o. [id number] at 663w4d by LMP who presents to maternity admissions reporting nausea and vomiting.  Tells me she has not had diarrhea.  C/O crampy colicky pain in upper abdomen between umbilicus and epigastrum.  Actively vomiting.  Has some bleeding, but has had this for several days.  Is in process of ruling out ectopic.  Due for repeat HCG tomorrow.  Just Had US 5 days ago, which did not see IUP.  She denies vaginal itching/burning, urinary symptoms, h/a, dizziness, or fever/chills.    Emesis   This is a new problem. The current episode started today. The problem occurs intermittently. The problem has been unchanged. There has been no fever. Associated symptoms include abdominal pain and diarrhea. Pertinent negatives include no chills, coughing, dizziness, fever, headaches or myalgias. She has tried nothing for the symptoms.  Diarrhea   This is a new (Though denies diarrhea to me, tells RN she has it) problem. The current episode started today. Associated symptoms include abdominal pain and vomiting. Pertinent negatives include no chills, coughing, fever, headaches or myalgias. Nothing aggravates the symptoms. There are no known risk factors. She has tried nothing for the symptoms.    RN Note: Received pt from EMS with nausea, vomiting, diarrhea for past few hours.  Past Medical History:  Diagnosis Date  . Medical history non-contributory    Past Surgical History:  Procedure Laterality Date  . CHOLECYSTECTOMY  2012   Social History   Social History  . Marital status: Single    Spouse name: N/A  . Number of children: N/A  . Years of education: N/A   Occupational History  . Not on file.   Social History Main Topics  . Smoking status: Current Every Day Smoker    Packs/day: 0.50    Types: Cigarettes  . Smokeless tobacco: Never  Used  . Alcohol use No  . Drug use: Unknown  . Sexual activity: Yes    Birth control/ protection: None   Other Topics Concern  . Not on file   Social History Narrative  . No narrative on file   No current facility-administered medications on file prior to encounter.    Current Outpatient Prescriptions on File Prior to Encounter  Medication Sig Dispense Refill  . promethazine (PHENERGAN) 25 MG tablet Take 0.5-1 tablets (12.5-25 mg total) by mouth every 6 (six) hours as needed. 30 tablet 0   No Known Allergies  I have reviewed patient's Past Medical Hx, Surgical Hx, Family Hx, Social Hx, medications and allergies.   ROS:  Review of Systems  Constitutional: Negative for chills and fever.  Respiratory: Negative for cough.   Gastrointestinal: Positive for abdominal pain, diarrhea and vomiting.  Musculoskeletal: Negative for myalgias.  Neurological: Negative for dizziness and headaches.   Review of Systems  Other systems negative   Physical Exam  Physical Exam No data found.  Constitutional: Well-developed, well-nourished female in no acute distress, but very uncomfortable with intestinal cramping.  Cardiovascular: normal rate and rhythm Respiratory: normal effort, clear to auscultation bilaterally GI: Abd soft, non-tender. Pos BS x 4 MS: Extremities nontender, no edema, normal ROM Neurologic: Alert and oriented x 4.  GU: Neg CVAT.  PELVIC EXAM: deferred due to recent exam.   LAB RESULTS   Ref. Range 02/19/2016 11:31 02/25/2016 22:01 02/29/2016 08:27 03/01/2016 05:28  HCG,  Beta Chain, Quant, S Latest Ref Range: <5 mIU/mL 118 (H) 460 (H) 651 (H) 860 (H)   --/--/A POS (02/16 1131)  IMAGING US Ob Comp Less 14 Wks  Result Date: 02/25/2016 CLINICAL DATA:  Cramping and bleeding. Estimated gestational age by last menstrual period equals 5.0 days EXAM: OBSTETRIC <14 WK [place] AND TRANSVAGINAL OB US TECHNIQUE: Both transabdominal and transvaginal ultrasound examinations were  performed for complete evaluation of the gestation as well as the maternal uterus, adnexal regions, and pelvic cul-de-sac. Transvaginal technique was performed to assess early pregnancy. COMPARISON:  None. FINDINGS: Intrauterine gestational sac: Not identified Yolk sac:  Absent Embryo:  ABSENT Subchorionic hemorrhage:  None visualized. Maternal uterus/adnexae: Normal uterus. NORMAL OVARIES. TRACE FREE FLUID. IMPRESSION: No intrauterine gestational sac, yolk sac, or fetal pole identified. Differential considerations include intrauterine pregnancy too early to be sonographically visualized, missed abortion, or ectopic pregnancy. Followup ultrasound is recommended in 10-14 days for further evaluation. Electronically Signed   By: Genevive Bi M.D.   On: 02/25/2016 21:32   US Ob Comp Less 14 Wks  Result Date: 02/19/2016 CLINICAL DATA:  Pregnant, bleeding/cramping x1 day EXAM: OBSTETRIC <14 WK [place] AND TRANSVAGINAL OB US TECHNIQUE: Both transabdominal and transvaginal ultrasound examinations were performed for complete evaluation of the gestation as well as the maternal uterus, adnexal regions, and pelvic cul-de-sac. Transvaginal technique was performed to assess early pregnancy. COMPARISON:  None. FINDINGS: Intrauterine gestational sac: None Yolk sac:  Not Visualized. Embryo:  Not Visualized. Subchorionic hemorrhage:  None visualized. Maternal uterus/adnexae: Endometrial complex measures 15 mm. Bilateral ovaries are within normal limits. Trace pelvic fluid. IMPRESSION: No IUP is visualized. By definition, in the setting of a positive pregnancy test, this reflects a pregnancy of unknown location. Differential considerations include early normal IUP, abnormal IUP/missed abortion, or nonvisualized ectopic pregnancy. Serial beta HCG is suggested. Consider repeat pelvic ultrasound in 14 days, as clinically warranted. Electronically Signed   By: Charline Bills M.D.   On: 02/19/2016 12:20   US Ob  Transvaginal  Result Date: 02/25/2016 CLINICAL DATA:  Cramping and bleeding. Estimated gestational age by last menstrual period equals 5.0 days EXAM: OBSTETRIC <14 WK [place] AND TRANSVAGINAL OB US TECHNIQUE: Both transabdominal and transvaginal ultrasound examinations were performed for complete evaluation of the gestation as well as the maternal uterus, adnexal regions, and pelvic cul-de-sac. Transvaginal technique was performed to assess early pregnancy. COMPARISON:  None. FINDINGS: Intrauterine gestational sac: Not identified Yolk sac:  Absent Embryo:  ABSENT Subchorionic hemorrhage:  None visualized. Maternal uterus/adnexae: Normal uterus. NORMAL OVARIES. TRACE FREE FLUID. IMPRESSION: No intrauterine gestational sac, yolk sac, or fetal pole identified. Differential considerations include intrauterine pregnancy too early to be sonographically visualized, missed abortion, or ectopic pregnancy. Followup ultrasound is recommended in 10-14 days for further evaluation. Electronically Signed   By: Genevive Bi M.D.   On: 02/25/2016 21:32   US Ob Transvaginal  Result Date: 02/19/2016 CLINICAL DATA:  Pregnant, bleeding/cramping x1 day EXAM: OBSTETRIC <14 WK [place] AND TRANSVAGINAL OB US TECHNIQUE: Both transabdominal and transvaginal ultrasound examinations were performed for complete evaluation of the gestation as well as the maternal uterus, adnexal regions, and pelvic cul-de-sac. Transvaginal technique was performed to assess early pregnancy. COMPARISON:  None. FINDINGS: Intrauterine gestational sac: None Yolk sac:  Not Visualized. Embryo:  Not Visualized. Subchorionic hemorrhage:  None visualized. Maternal uterus/adnexae: Endometrial complex measures 15 mm. Bilateral ovaries are within normal limits. Trace pelvic fluid. IMPRESSION: No IUP is visualized. By definition, in the setting of a positive  pregnancy test, this reflects a pregnancy of unknown location. Differential considerations include early normal IUP,  abnormal IUP/missed abortion, or nonvisualized ectopic pregnancy. Serial beta HCG is suggested. Consider repeat pelvic ultrasound in 14 days, as clinically warranted. Electronically Signed   By: Charline Bills M.D.   On: 02/19/2016 12:20    MAU Management/MDM: Kenard Gower blood due to abdominal pain. CBC showed normal WBC Normal chemistries Quant drawn since we were drawing blood, though it is a day early .  Not rising appropriately, but there is no new lower abdominal pain. Pain is primarily related to diarrhea and is better now. States nausea is much better now.  We gave one liter of fluids with Phenergan for nausea   ASSESSMENT Pregnancy at [redacted]w[redacted]d by LMP Abdominal colicky pain Probable viral gastroenteritis Pregnancy of unknown location  PLAN Discharge home Advance diet as tolerated Has Phenergan at home  Plan to repeat HCG level in 24 hours in clinic per 11:00 am schedule  Ectopic precautions   Pt stable at time of discharge. Encouraged to return here or to other Urgent Care/ED if she develops worsening of symptoms, increase in pain, fever, or other concerning symptoms.    Wynelle Bourgeois CNM, MSN Certified Nurse-Midwife 03/01/2016  4:51 AM

## 2016-03-02 ENCOUNTER — Encounter: Payer: Self-pay | Admitting: Obstetrics & Gynecology

## 2016-03-02 ENCOUNTER — Ambulatory Visit (INDEPENDENT_AMBULATORY_CARE_PROVIDER_SITE_OTHER): Payer: Medicaid Other | Admitting: Obstetrics & Gynecology

## 2016-03-02 VITALS — BP 131/42 | HR 81 | Ht 64.0 in | Wt 224.0 lb

## 2016-03-02 DIAGNOSIS — O209 Hemorrhage in early pregnancy, unspecified: Secondary | ICD-10-CM | POA: Diagnosis not present

## 2016-03-02 DIAGNOSIS — O3680X0 Pregnancy with inconclusive fetal viability, not applicable or unspecified: Secondary | ICD-10-CM | POA: Insufficient documentation

## 2016-03-02 LAB — HCG, QUANTITATIVE, PREGNANCY: HCG, BETA CHAIN, QUANT, S: 864 m[IU]/mL — AB (ref <5)

## 2016-03-02 NOTE — Progress Notes (Signed)
   Subjective:    Patient ID: Michele Brady, female    DOB: [birth year], 26 y.o.   MRN: [id number]  HPI: [id number], currently 7966w5d, with intermittent vaginal bleeding x 12 days. Bleeding stopped briefly 2/20-2/23 but has since resumed. Reports color is dark red. Saturates about 1 pad/5-6 hours. No associated abd cramping/pain/ nausea or voming. Has not tried anything for symptoms. Reports recent viral URI yesterday that has since resolved.   Review of Systems: Gen: Denies fever, chills, fatigue Cardiac: Denies palpitations, chest pain, lower extremity edema Resp: Denies SOB, cough GI: Denies n/v, constipation/ diarrhea, blood in stool GU: Denies dysuria, frequency, urgency. Pos for bloody vaginal discharge (see HPI).      Objective:   Physical Exam Gen: Alert and Oriented x 4, not ill-appearing, in no acute distress Cardiac: RRR, S1 and S2 present.  Resp: Lungs clear bilaterally. No use of accessory muscles GI: Rounded, symmetrical abdomen. BS + x 4 quadrants. No tenderness. No rebound, no guarding SSE:  Pink nml rugae, cervix no lesion nor POC at os, small amount of blood Uterus:  Nontender, mobile Adnexa:  Nontender, no mass palpated.  Limited by habitus.  Vitals:   03/02/16 1341  BP: (!) 131/42  Pulse: 81  Weight: 224 lb (101.6 kg)  Height: 5\' 4"  (1.626 m)        Assessment & Plan:  1. Vaginal bleeding in early pregnancy  - Advised to return for U/S tmrw, repeat STAT beta HCG - With any increased amount of bleeding/severe abd pain overnight, instructed to go to MAU  -strict ectopic precautions given

## 2016-03-02 NOTE — Progress Notes (Signed)
Patient here for stat bhcg. Patient reports continued bleeding like a period but denies pain. Per Venia CarbonJennifer Rasch, patient should wait in lobby for results. Informed patient.

## 2016-03-03 ENCOUNTER — Other Ambulatory Visit: Payer: Self-pay | Admitting: Obstetrics and Gynecology

## 2016-03-03 ENCOUNTER — Inpatient Hospital Stay (HOSPITAL_COMMUNITY)
Admission: AD | Admit: 2016-03-03 | Discharge: 2016-03-03 | Disposition: A | Discharge status: Home / Self Care | Payer: Medicaid Other | Source: Ambulatory Visit | Attending: Obstetrics & Gynecology | Admitting: Obstetrics & Gynecology

## 2016-03-03 ENCOUNTER — Other Ambulatory Visit: Payer: Self-pay | Admitting: Advanced Practice Midwife

## 2016-03-03 ENCOUNTER — Ambulatory Visit (HOSPITAL_COMMUNITY)
Admission: RE | Admit: 2016-03-03 | Discharge: 2016-03-03 | Disposition: A | Discharge status: Home / Self Care | Payer: Medicaid Other | Source: Ambulatory Visit | Attending: Obstetrics and Gynecology | Admitting: Obstetrics and Gynecology

## 2016-03-03 DIAGNOSIS — O00102 Left tubal pregnancy without intrauterine pregnancy: Secondary | ICD-10-CM | POA: Insufficient documentation

## 2016-03-03 DIAGNOSIS — Z3A01 Less than 8 weeks gestation of pregnancy: Secondary | ICD-10-CM | POA: Diagnosis not present

## 2016-03-03 DIAGNOSIS — F17210 Nicotine dependence, cigarettes, uncomplicated: Secondary | ICD-10-CM | POA: Diagnosis not present

## 2016-03-03 DIAGNOSIS — O0281 Inappropriate change in quantitative human chorionic gonadotropin (hCG) in early pregnancy: Secondary | ICD-10-CM | POA: Insufficient documentation

## 2016-03-03 DIAGNOSIS — O99330 Smoking (tobacco) complicating pregnancy, unspecified trimester: Secondary | ICD-10-CM | POA: Diagnosis not present

## 2016-03-03 DIAGNOSIS — Z3A00 Weeks of gestation of pregnancy not specified: Secondary | ICD-10-CM | POA: Insufficient documentation

## 2016-03-03 DIAGNOSIS — R1900 Intra-abdominal and pelvic swelling, mass and lump, unspecified site: Secondary | ICD-10-CM | POA: Diagnosis present

## 2016-03-03 DIAGNOSIS — O3680X0 Pregnancy with inconclusive fetal viability, not applicable or unspecified: Secondary | ICD-10-CM

## 2016-03-03 LAB — CBC WITH DIFFERENTIAL/PLATELET
Basophils Absolute: 0 10*3/uL (ref 0.0–0.1)
Basophils Relative: 0 %
EOS PCT: 2 %
Eosinophils Absolute: 0.1 10*3/uL (ref 0.0–0.7)
HEMATOCRIT: 35.2 % — AB (ref 36.0–46.0)
Hemoglobin: 12.3 g/dL (ref 12.0–15.0)
LYMPHS ABS: 3.5 10*3/uL (ref 0.7–4.0)
LYMPHS PCT: 51 %
MCH: 31.8 pg (ref 26.0–34.0)
MCHC: 34.9 g/dL (ref 30.0–36.0)
MCV: 91 fL (ref 78.0–100.0)
MONO ABS: 0.2 10*3/uL (ref 0.1–1.0)
Monocytes Relative: 4 %
NEUTROS ABS: 2.9 10*3/uL (ref 1.7–7.7)
Neutrophils Relative %: 43 %
PLATELETS: 306 10*3/uL (ref 150–400)
RBC: 3.87 MIL/uL (ref 3.87–5.11)
RDW: 13.8 % (ref 11.5–15.5)
WBC: 6.8 10*3/uL (ref 4.0–10.5)

## 2016-03-03 LAB — CREATININE, SERUM
Creatinine, Ser: 0.62 mg/dL (ref 0.44–1.00)
GFR calc non Af Amer: >60 mL/min (ref >60)

## 2016-03-03 LAB — TYPE AND SCREEN
ABO/RH(D): A POS
ANTIBODY SCREEN: POSITIVE
DAT, IgG: NEGATIVE
PT AG TYPE: NEGATIVE

## 2016-03-03 LAB — BUN: BUN: 5 mg/dL — ABNORMAL LOW (ref 6–20)

## 2016-03-03 LAB — AST: AST: 8 U/L — ABNORMAL LOW (ref 15–41)

## 2016-03-03 MED ORDER — OXYCODONE-ACETAMINOPHEN 5-325 MG PO TABS: 1.0000 | ORAL_TABLET | Freq: Four times a day (QID) | ORAL | 0 refills | Status: AC | PRN

## 2016-03-03 MED ORDER — METHOTREXATE INJECTION FOR WOMEN'S HOSPITAL
50.0000 mg/m2 | Freq: Once | INTRAMUSCULAR | Status: AC
  Administered 2016-03-03: 110 mg via INTRAMUSCULAR
  Filled 2016-03-03: qty 2.2

## 2016-03-03 MED ORDER — OXYCODONE-ACETAMINOPHEN 5-325 MG PO TABS: 1.0000 | ORAL_TABLET | Freq: Four times a day (QID) | ORAL | 0 refills | Status: DC | PRN

## 2016-03-03 NOTE — MAU Provider Note (Signed)
Chief Complaint: No chief complaint on file.   None     SUBJECTIVE HPI: Michele HabermannSharday Brady is a 26 y.o. [id number] at [id number] by LMP who presents to maternity admissions sent from radiology after scheduled outpatient US reveals likely ectopic pregnancy on left side.  No IUP visualized with suspicious left adnexal mass.  Quant hcg with inappropriate rise over time: 02/19/16  hcg 118 02/25/16 hcg 460 02/29/16  hcg 651 03/02/16 hcg 864  Pt denies pain, reports menstrual-like bleeding is unchanged in last week.  She has not tried any treatments, nothing makes the bleeding better or worse. It has no associated symptoms.  She denies vaginal itching/burning, urinary symptoms, h/a, dizziness, n/v, or fever/chills.     HPI  Past Medical History:  Diagnosis Date  . Medical history non-contributory    Past Surgical History:  Procedure Laterality Date  . CHOLECYSTECTOMY  2012   Social History   Social History  . Marital status: Single    Spouse name: N/A  . Number of children: N/A  . Years of education: N/A   Occupational History  . Not on file.   Social History Main Topics  . Smoking status: Current Every Day Smoker    Packs/day: 0.50    Types: Cigarettes  . Smokeless tobacco: Never Used  . Alcohol use No  . Drug use: Unknown  . Sexual activity: Yes    Birth control/ protection: None   Other Topics Concern  . Not on file   Social History Narrative  . No narrative on file   No current facility-administered medications on file prior to encounter.    Current Outpatient Prescriptions on File Prior to Encounter  Medication Sig Dispense Refill  . acetaminophen (TYLENOL) 500 MG tablet Take 500 mg by mouth every 6 (six) hours as needed.    . promethazine (PHENERGAN) 25 MG tablet Take 0.5-1 tablets (12.5-25 mg total) by mouth every 6 (six) hours as needed. (Patient not taking: Reported on 03/02/2016) 30 tablet 0   No Known Allergies  ROS:  Review of Systems  Constitutional: Negative for  chills, fatigue and fever.  Respiratory: Negative for shortness of breath.   Cardiovascular: Negative for chest pain.  Gastrointestinal: Negative for nausea and vomiting.  Genitourinary: Positive for vaginal bleeding. Negative for difficulty urinating, dysuria, flank pain, pelvic pain, vaginal discharge and vaginal pain.  Neurological: Negative for dizziness and headaches.  Psychiatric/Behavioral: Negative.      I have reviewed patient's Past Medical Hx, Surgical Hx, Family Hx, Social Hx, medications and allergies.   Physical Exam   Patient Vitals for the past 24 hrs:  Height Weight  03/03/16 1136 5\' 4"  (1.626 m) 227 lb (103 kg)   Constitutional: Well-developed, well-nourished female in no acute distress.  Cardiovascular: normal rate Respiratory: normal effort GI: Abd soft, non-tender. Pos BS x 4 MS: Extremities nontender, no edema, normal ROM Neurologic: Alert and oriented x 4.  GU: Neg CVAT.  PELVIC EXAM: Deferred  LAB RESULTS Results for orders placed or performed during the hospital encounter of 03/03/16 (from the past 24 hour(s))  CBC WITH DIFFERENTIAL     Status: Abnormal   Collection Time: 03/03/16 11:25 AM  Result Value Ref Range   WBC 6.8 4.0 - 10.5 K/uL   RBC 3.87 3.87 - 5.11 MIL/uL   Hemoglobin 12.3 12.0 - 15.0 g/dL   HCT 45.435.2 (L) 09.836.0 - 11.946.0 %   MCV 91.0 78.0 - 100.0 fL   MCH 31.8 26.0 - 34.0 pg  MCHC 34.9 30.0 - 36.0 g/dL   RDW 19.1 47.8 - 29.5 %   Platelets 306 150 - 400 K/uL   Neutrophils Relative % 43 %   Neutro Abs 2.9 1.7 - 7.7 K/uL   Lymphocytes Relative 51 %   Lymphs Abs 3.5 0.7 - 4.0 K/uL   Monocytes Relative 4 %   Monocytes Absolute 0.2 0.1 - 1.0 K/uL   Eosinophils Relative 2 %   Eosinophils Absolute 0.1 0.0 - 0.7 K/uL   Basophils Relative 0 %   Basophils Absolute 0.0 0.0 - 0.1 K/uL  BUN     Status: Abnormal   Collection Time: 03/03/16 11:25 AM  Result Value Ref Range   BUN 5 (L) 6 - 20 mg/dL  Creatinine, serum     Status: None    Collection Time: 03/03/16 11:25 AM  Result Value Ref Range   Creatinine, Ser 0.62 0.44 - 1.00 mg/dL   GFR calc non Af Amer >60 >60 mL/min   GFR calc Af Amer >60 >60 mL/min  AST     Status: Abnormal   Collection Time: 03/03/16 11:25 AM  Result Value Ref Range   AST 8 (L) 15 - 41 U/L    --/--/A POS (02/16 1131)  IMAGING US Ob Comp Less 14 Wks  Result Date: 03/03/2016 CLINICAL DATA:  26 year old pregnant female with 2 weeks of intermittent vaginal bleeding. Spotting today. Inappropriate quantitative beta HCG level rise (864 on 03/02/2016, 860 on 03/01/2016, 651 on 02/29/2016 and 460 on 02/25/2016). Non localization of the pregnancy on 2 prior obstetric scans, most recent performed on 02/25/2016. EDC by LMP: 10/21/2016, projecting to an expected gestational age of [redacted] weeks 6 days. EXAM: TRANSVAGINAL OB ULTRASOUND; OBSTETRIC <14 WK ULTRASOUND TECHNIQUE: Transvaginal and transabdominal ultrasound was performed for complete evaluation of the gestation as well as the maternal uterus, adnexal regions, and pelvic cul-de-sac. COMPARISON:  02/25/2016 obstetric scan. FINDINGS: Anteverted uterus measures 10.3 x 4.5 x 5.5 cm. No uterine fibroids. Scattered punctate hyperechoic foci in the subendometrial myometrium, suggestive of mild diffuse adenomyosis. Bilayer endometrial thickness 5 mm, decreased from 9 mm on 02/25/2016. Trace fluid in the endometrial cavity. No intrauterine gestational sac or focal endometrial mass demonstrated. Right ovary measures 3.0 x 2.2 x 2.9 cm and appears normal. No right ovarian or right adnexal masses. Left ovary measures 2.7 x 1.8 x 1.7 cm and appears normal. On the transabdominal portion of the study, there is a 2.5 x 1.5 x 2.7 cm solid-appearing high left adnexal mass located anterior to and apparently separate from the left ovary. No yolk sac, embryo or embryonic cardiac activity are demonstrated within this left adnexal mass. No abnormal free fluid is seen in the pelvis.  IMPRESSION: 1. No intrauterine gestational sac. Bilayer endometrial thickness 5 mm, decreased in the interval. Trace fluid in the endometrial cavity. 2. Indeterminate solid-appearing 2.5 x 1.5 x 2.7 cm high left adnexal mass located anterior to and apparently separate from the left ovary, suspicious for a left tubal ectopic pregnancy. 3. No abnormal free fluid in the pelvis. These results were called by telephone at the time of interpretation on 03/03/2016 at 9:34 am to Dr. Dolan Amen, who verbally acknowledged these results. Electronically Signed   By: Delbert Phenix M.D.   On: 03/03/2016 09:49   US Ob Comp Less 14 Wks  Result Date: 02/25/2016 CLINICAL DATA:  Cramping and bleeding. Estimated gestational age by last menstrual period equals 5.0 days EXAM: OBSTETRIC <14 WK [place] AND TRANSVAGINAL OB US TECHNIQUE:  Both transabdominal and transvaginal ultrasound examinations were performed for complete evaluation of the gestation as well as the maternal uterus, adnexal regions, and pelvic cul-de-sac. Transvaginal technique was performed to assess early pregnancy. COMPARISON:  None. FINDINGS: Intrauterine gestational sac: Not identified Yolk sac:  Absent Embryo:  ABSENT Subchorionic hemorrhage:  None visualized. Maternal uterus/adnexae: Normal uterus. NORMAL OVARIES. TRACE FREE FLUID. IMPRESSION: No intrauterine gestational sac, yolk sac, or fetal pole identified. Differential considerations include intrauterine pregnancy too early to be sonographically visualized, missed abortion, or ectopic pregnancy. Followup ultrasound is recommended in 10-14 days for further evaluation. Electronically Signed   By: Genevive Bi M.D.   On: 02/25/2016 21:32   US Ob Comp Less 14 Wks  Result Date: 02/19/2016 CLINICAL DATA:  Pregnant, bleeding/cramping x1 day EXAM: OBSTETRIC <14 WK [place] AND TRANSVAGINAL OB US TECHNIQUE: Both transabdominal and transvaginal ultrasound examinations were performed for complete evaluation of the  gestation as well as the maternal uterus, adnexal regions, and pelvic cul-de-sac. Transvaginal technique was performed to assess early pregnancy. COMPARISON:  None. FINDINGS: Intrauterine gestational sac: None Yolk sac:  Not Visualized. Embryo:  Not Visualized. Subchorionic hemorrhage:  None visualized. Maternal uterus/adnexae: Endometrial complex measures 15 mm. Bilateral ovaries are within normal limits. Trace pelvic fluid. IMPRESSION: No IUP is visualized. By definition, in the setting of a positive pregnancy test, this reflects a pregnancy of unknown location. Differential considerations include early normal IUP, abnormal IUP/missed abortion, or nonvisualized ectopic pregnancy. Serial beta HCG is suggested. Consider repeat pelvic ultrasound in 14 days, as clinically warranted. Electronically Signed   By: Charline Bills M.D.   On: 02/19/2016 12:20   US Ob Transvaginal  Result Date: 03/03/2016 CLINICAL DATA:  26 year old pregnant female with 2 weeks of intermittent vaginal bleeding. Spotting today. Inappropriate quantitative beta HCG level rise (864 on 03/02/2016, 860 on 03/01/2016, 651 on 02/29/2016 and 460 on 02/25/2016). Non localization of the pregnancy on 2 prior obstetric scans, most recent performed on 02/25/2016. EDC by LMP: 10/21/2016, projecting to an expected gestational age of [redacted] weeks 6 days. EXAM: TRANSVAGINAL OB ULTRASOUND; OBSTETRIC <14 WK ULTRASOUND TECHNIQUE: Transvaginal and transabdominal ultrasound was performed for complete evaluation of the gestation as well as the maternal uterus, adnexal regions, and pelvic cul-de-sac. COMPARISON:  02/25/2016 obstetric scan. FINDINGS: Anteverted uterus measures 10.3 x 4.5 x 5.5 cm. No uterine fibroids. Scattered punctate hyperechoic foci in the subendometrial myometrium, suggestive of mild diffuse adenomyosis. Bilayer endometrial thickness 5 mm, decreased from 9 mm on 02/25/2016. Trace fluid in the endometrial cavity. No intrauterine gestational sac  or focal endometrial mass demonstrated. Right ovary measures 3.0 x 2.2 x 2.9 cm and appears normal. No right ovarian or right adnexal masses. Left ovary measures 2.7 x 1.8 x 1.7 cm and appears normal. On the transabdominal portion of the study, there is a 2.5 x 1.5 x 2.7 cm solid-appearing high left adnexal mass located anterior to and apparently separate from the left ovary. No yolk sac, embryo or embryonic cardiac activity are demonstrated within this left adnexal mass. No abnormal free fluid is seen in the pelvis. IMPRESSION: 1. No intrauterine gestational sac. Bilayer endometrial thickness 5 mm, decreased in the interval. Trace fluid in the endometrial cavity. 2. Indeterminate solid-appearing 2.5 x 1.5 x 2.7 cm high left adnexal mass located anterior to and apparently separate from the left ovary, suspicious for a left tubal ectopic pregnancy. 3. No abnormal free fluid in the pelvis. These results were called by telephone at the time of interpretation on 03/03/2016  at 9:34 am to Dr. Dolan Amen, who verbally acknowledged these results. Electronically Signed   By: Delbert Phenix M.D.   On: 03/03/2016 09:49   US Ob Transvaginal  Result Date: 02/25/2016 CLINICAL DATA:  Cramping and bleeding. Estimated gestational age by last menstrual period equals 5.0 days EXAM: OBSTETRIC <14 WK [place] AND TRANSVAGINAL OB US TECHNIQUE: Both transabdominal and transvaginal ultrasound examinations were performed for complete evaluation of the gestation as well as the maternal uterus, adnexal regions, and pelvic cul-de-sac. Transvaginal technique was performed to assess early pregnancy. COMPARISON:  None. FINDINGS: Intrauterine gestational sac: Not identified Yolk sac:  Absent Embryo:  ABSENT Subchorionic hemorrhage:  None visualized. Maternal uterus/adnexae: Normal uterus. NORMAL OVARIES. TRACE FREE FLUID. IMPRESSION: No intrauterine gestational sac, yolk sac, or fetal pole identified. Differential considerations include intrauterine  pregnancy too early to be sonographically visualized, missed abortion, or ectopic pregnancy. Followup ultrasound is recommended in 10-14 days for further evaluation. Electronically Signed   By: Genevive Bi M.D.   On: 02/25/2016 21:32   US Ob Transvaginal  Result Date: 02/19/2016 CLINICAL DATA:  Pregnant, bleeding/cramping x1 day EXAM: OBSTETRIC <14 WK [place] AND TRANSVAGINAL OB US TECHNIQUE: Both transabdominal and transvaginal ultrasound examinations were performed for complete evaluation of the gestation as well as the maternal uterus, adnexal regions, and pelvic cul-de-sac. Transvaginal technique was performed to assess early pregnancy. COMPARISON:  None. FINDINGS: Intrauterine gestational sac: None Yolk sac:  Not Visualized. Embryo:  Not Visualized. Subchorionic hemorrhage:  None visualized. Maternal uterus/adnexae: Endometrial complex measures 15 mm. Bilateral ovaries are within normal limits. Trace pelvic fluid. IMPRESSION: No IUP is visualized. By definition, in the setting of a positive pregnancy test, this reflects a pregnancy of unknown location. Differential considerations include early normal IUP, abnormal IUP/missed abortion, or nonvisualized ectopic pregnancy. Serial beta HCG is suggested. Consider repeat pelvic ultrasound in 14 days, as clinically warranted. Electronically Signed   By: Charline Bills M.D.   On: 02/19/2016 12:20    MAU Management/MDM: [place] reviewed.  Discussed results with pt, likely ectopic pregnancy with intact tube so offered methotrexate therapy today. Pt declined and wants to wait 48 hours for repeat quant hcg.  Consult Dr Erin Fulling who came to bedside and discussed results of labwork and [place] with pt and family member by phone.  Pt agrees to MTX therapy today.  Ordered labs to verify kidney function and reviewed results.  Methotrexate given.  Pt to f/u on Day 4 in MAU (Sunday) and Day 7 in the office.  Ectopic precautions/reasons to return to MAU for emergencies  reviewed. Rx for Percocet 5/325, take 1-2 Q 6 hours x 6 tabs and Phenergan 12.5-25 mg PO Q 6 hours.  Pt stable at time of discharge.  ASSESSMENT 1. Pelvic mass in female   2. Pregnancy of unknown anatomic location     PLAN Discharge home Allergies as of 03/03/2016   No Known Allergies     Medication List    TAKE these medications   acetaminophen 500 MG tablet Commonly known as:  TYLENOL Take 500 mg by mouth every 6 (six) hours as needed.   promethazine 25 MG tablet Commonly known as:  PHENERGAN Take 0.5-1 tablets (12.5-25 mg total) by mouth every 6 (six) hours as needed.        Sharen Counter Certified Nurse-Midwife 03/03/2016  12:36 PM

## 2017-01-13 ENCOUNTER — Encounter (HOSPITAL_COMMUNITY): Payer: Self-pay

## 2017-06-29 IMAGING — CT CT ABD-PELV W/ CM
2 of 4 series · 16 of 46 positions shown, 18 images · IV contrast (ISOVUE)
Comparison: None.

CLINICAL DATA: Acute onset of generalized abdominal pain, nausea
and vomiting. Initial encounter.

EXAM:
CT ABDOMEN AND PELVIS WITH CONTRAST
TECHNIQUE: Multidetector CT imaging of the abdomen and pelvis was performed
using the standard protocol following bolus administration of
intravenous contrast.
CONTRAST:  100mL [id number] IOPAMIDOL ([id number]) INJECTION 61%

[Series 2: abd/pel with · axial · 0.74mm/px · z∈[-472,-72]mm · 13 of 92 slices shown, 15 images]
[im 6/92  soft-tissue]
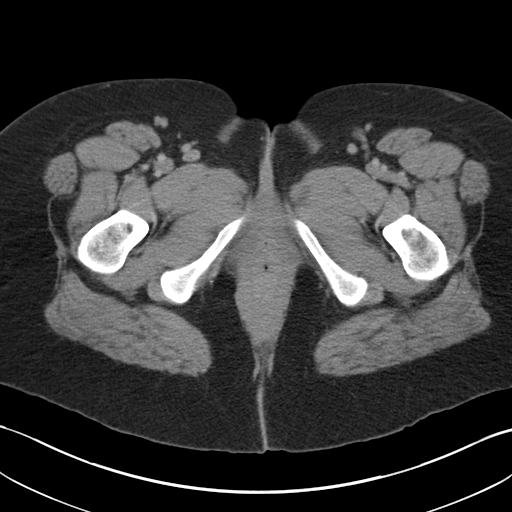
[im 6/92  bone]
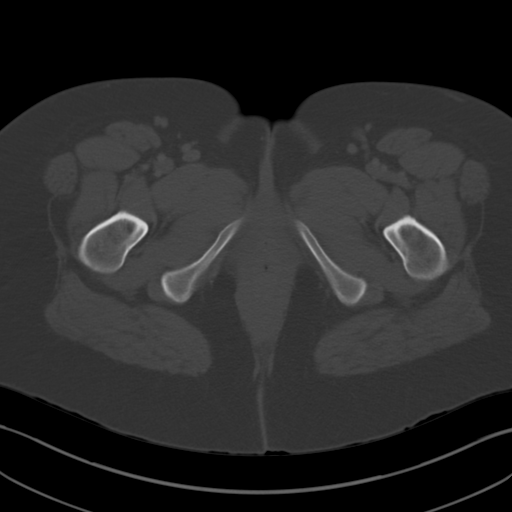
[im 11/92  soft-tissue]
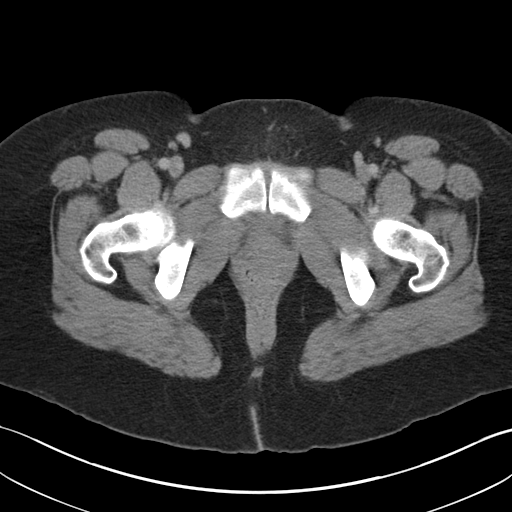
[im 22/92  soft-tissue]
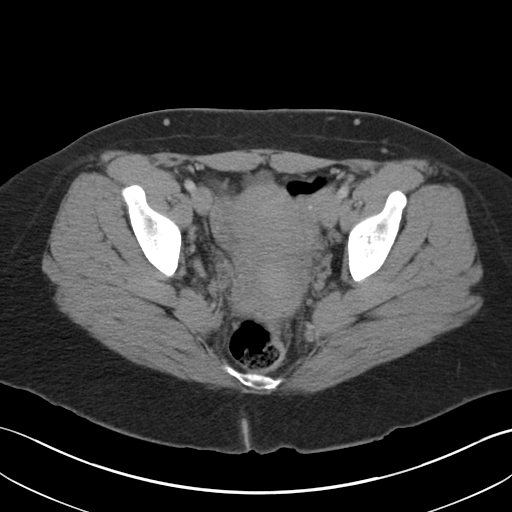
[im 27/92  soft-tissue]
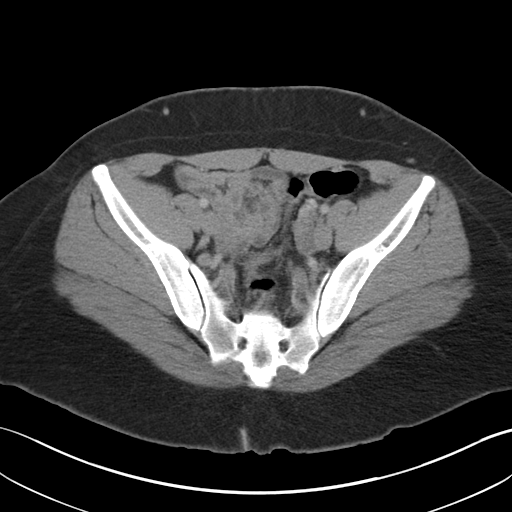
[im 33/92  soft-tissue]
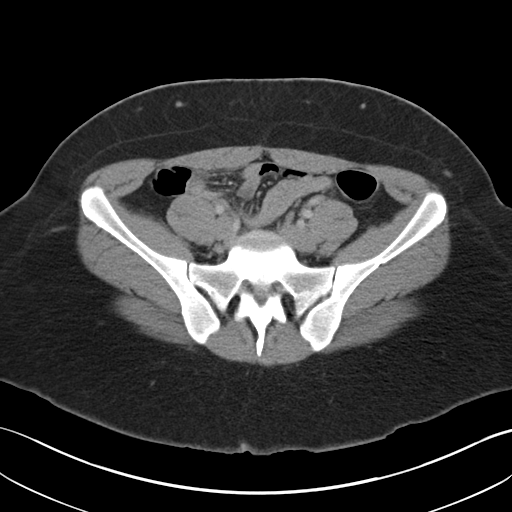
[im 38/92  soft-tissue]
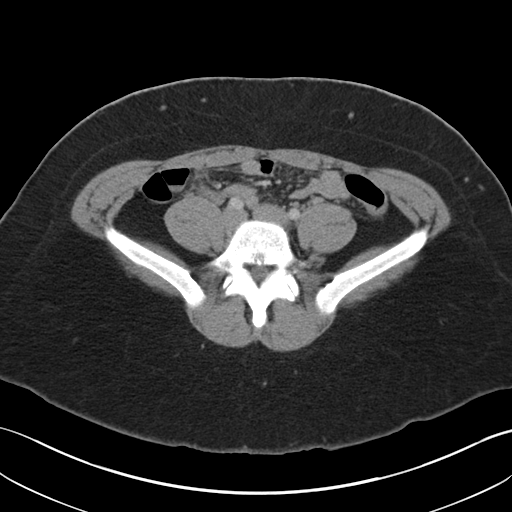
[im 49/92  soft-tissue]
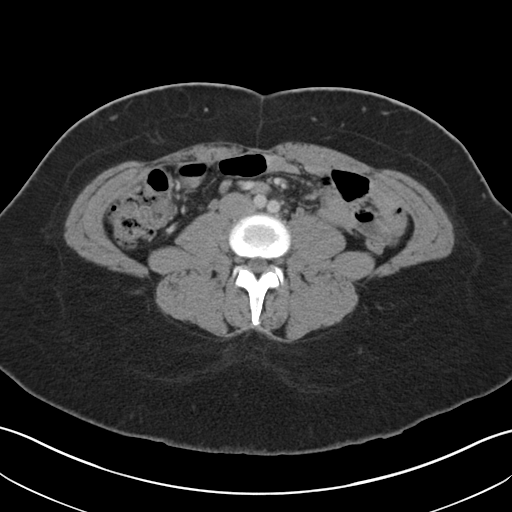
[im 54/92  soft-tissue]
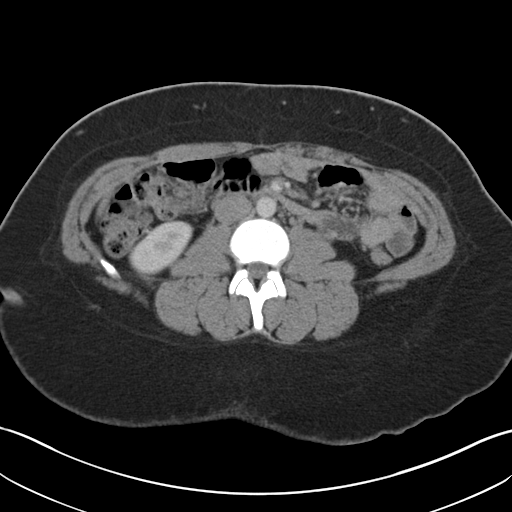
[im 59/92  soft-tissue]
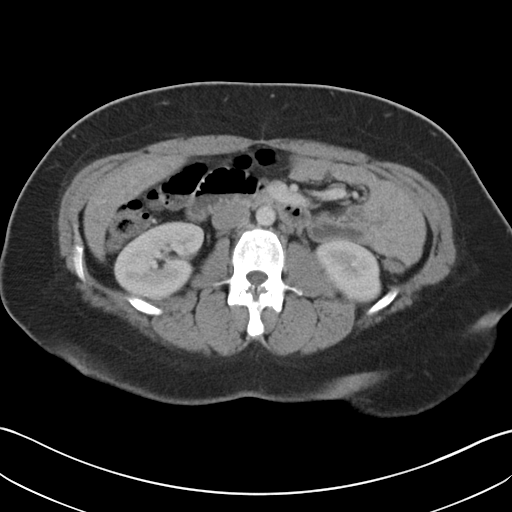
[im 59/92  bone]
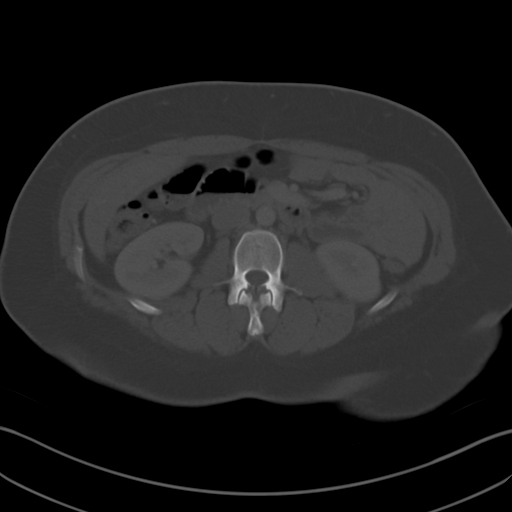
[im 65/92  soft-tissue]
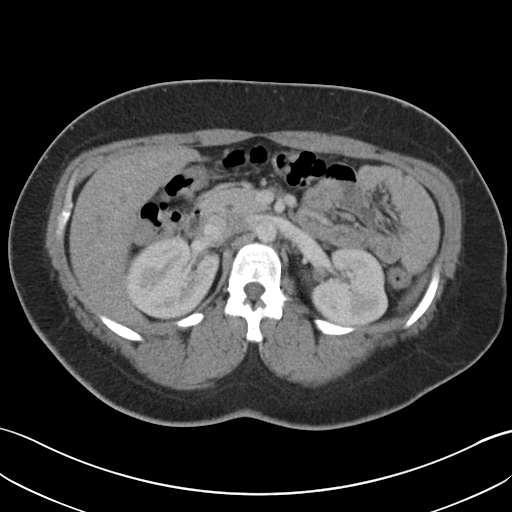
[im 70/92  soft-tissue]
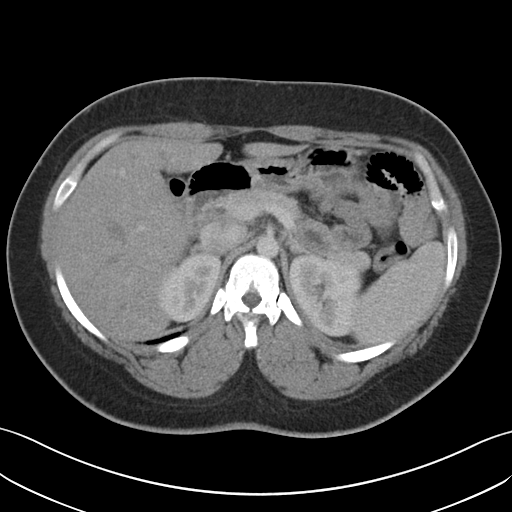
[im 81/92  soft-tissue]
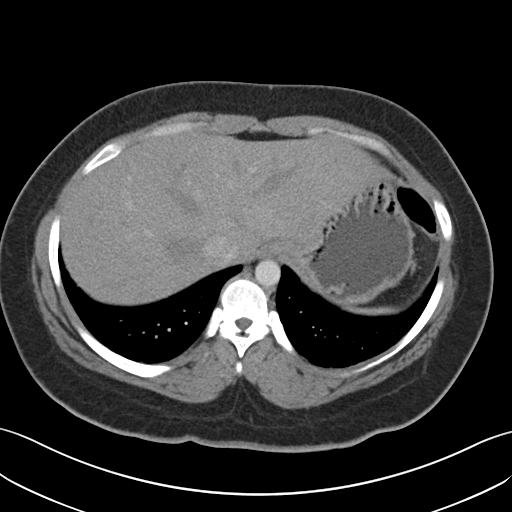
[im 86/92  soft-tissue]
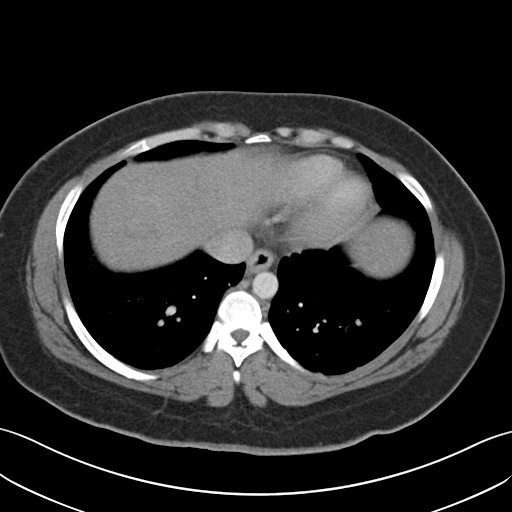

[Series 5: coronal a/|p · coronal · 0.74mm/px · 3 of 138 slices shown]
[im 46/138  soft-tissue]
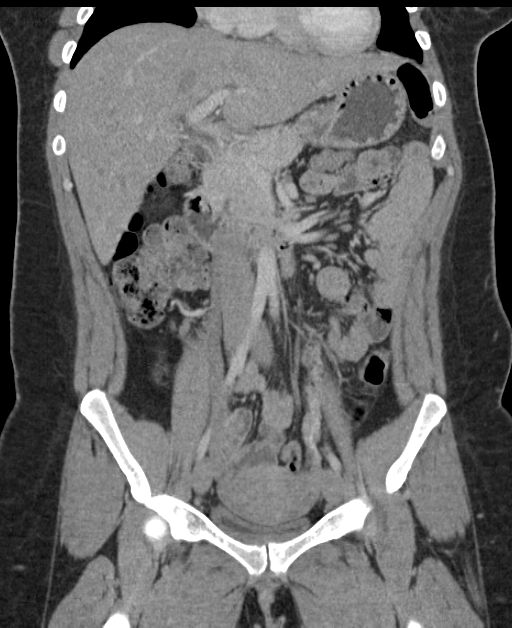
[im 61/138  soft-tissue]
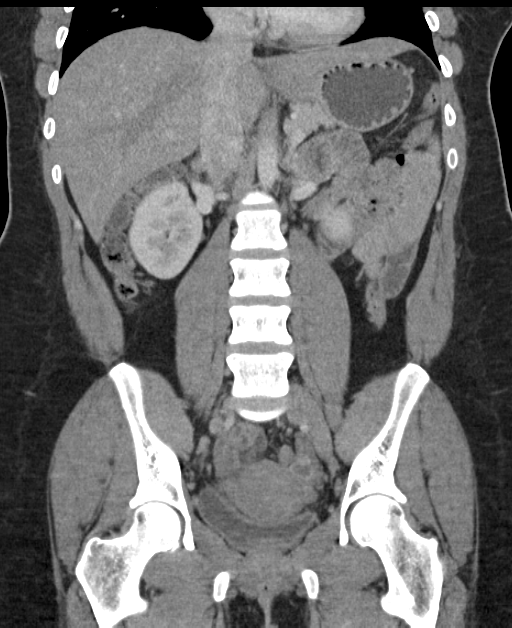
[im 77/138  soft-tissue]
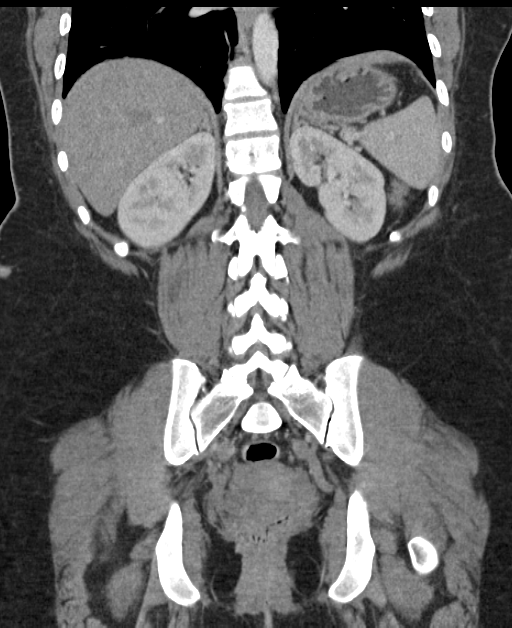

[16 of 46 positions shown; findings below may reference images not displayed]

FINDINGS: Minimal bibasilar atelectasis is noted.

The liver and spleen are unremarkable in appearance. The gallbladder
is not seen. The pancreas and adrenal glands are unremarkable.

The kidneys are unremarkable in appearance. There is no evidence of
hydronephrosis. No renal or ureteral stones are seen. No perinephric
stranding is appreciated.

No free fluid is identified. The small bowel is unremarkable in
appearance. The stomach is within normal limits. No acute vascular
abnormalities are seen.

The appendix is normal in caliber, without evidence of appendicitis.
The colon is grossly unremarkable in appearance.

The bladder is mildly distended and grossly unremarkable. The uterus
is unremarkable in appearance. The ovaries are grossly symmetric. No
suspicious adnexal masses are seen. No inguinal lymphadenopathy is
seen.

No acute osseous abnormalities are identified.
IMPRESSION: No acute abnormality seen within the abdomen or pelvis.

## 2018-02-12 IMAGING — US US OB TRANSVAGINAL
1 series · 15 of 28 positions shown · non-contrast
Comparison: None.

CLINICAL DATA: Cramping and bleeding. Estimated gestational age by
last menstrual period equals 5.0 days

EXAM:
OBSTETRIC <14 WK US AND TRANSVAGINAL OB US
TECHNIQUE: Both transabdominal and transvaginal ultrasound examinations were
performed for complete evaluation of the gestation as well as the
maternal uterus, adnexal regions, and pelvic cul-de-sac.
Transvaginal technique was performed to assess early pregnancy.

[Series 1: us ob transvaginal · 83 acquisitions, 15 frames shown]
[im 1/83]
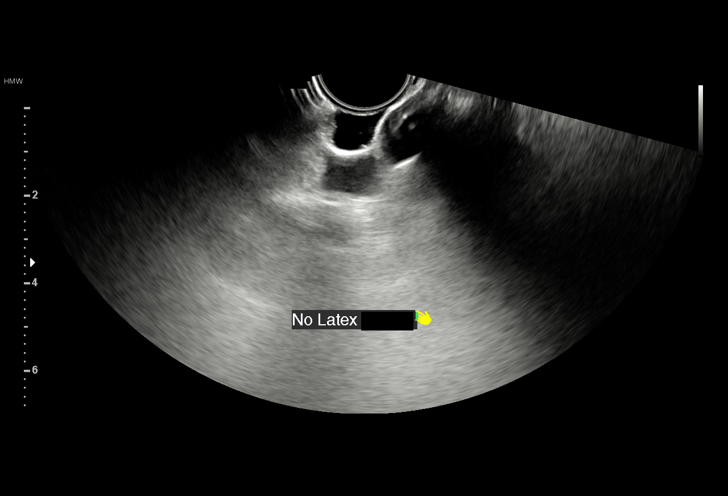
[im 7/83]
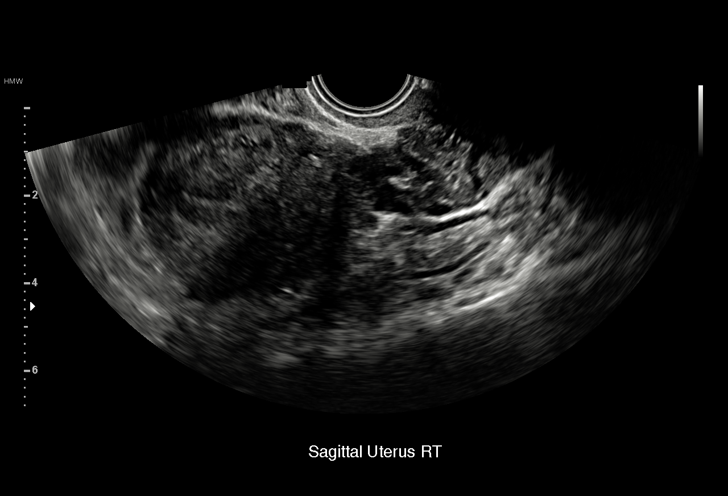
[im 13/83]
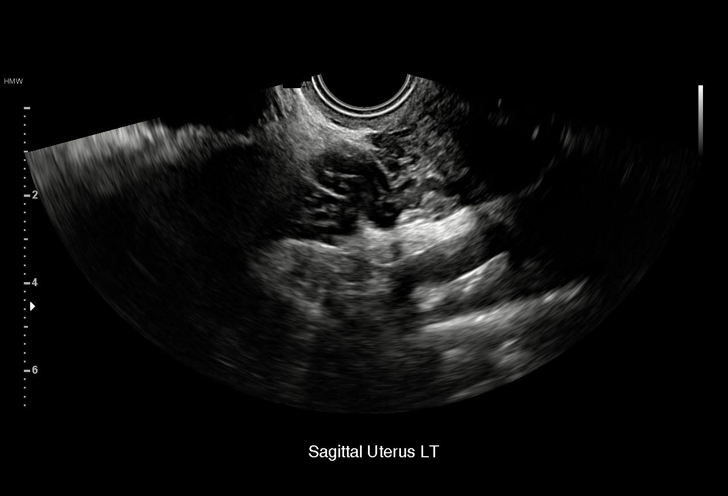
[im 19/83]
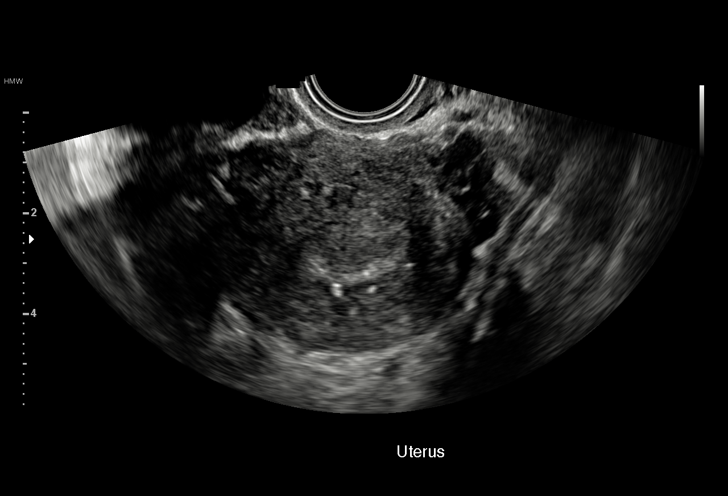
[im 25/83]
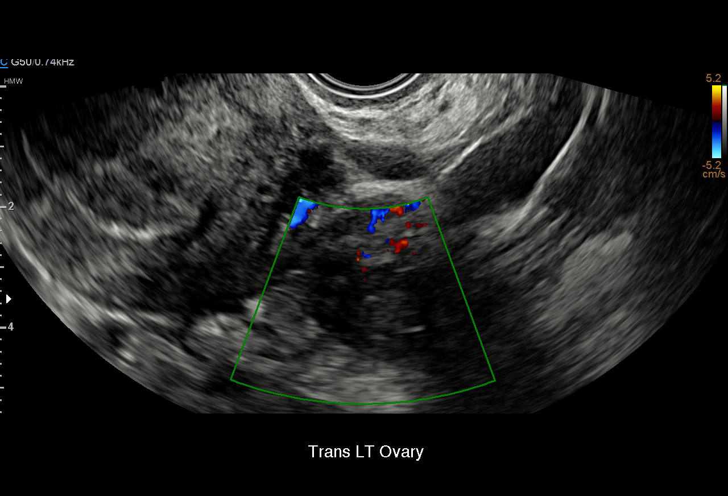
[im 31/83]
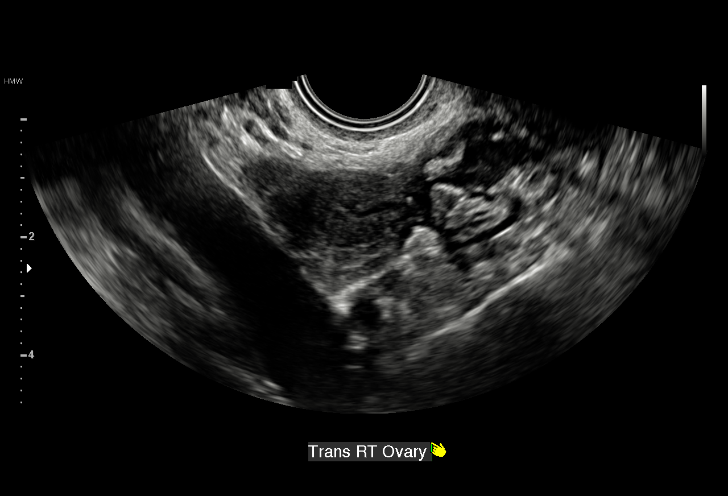
[im 37/83]
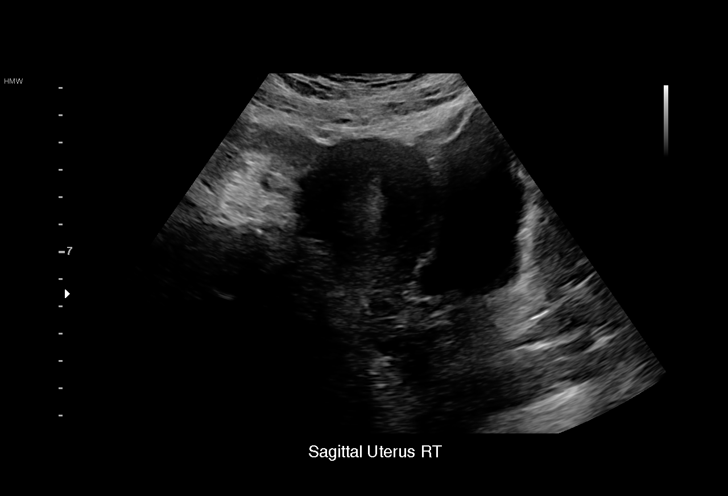
[im 43/83]
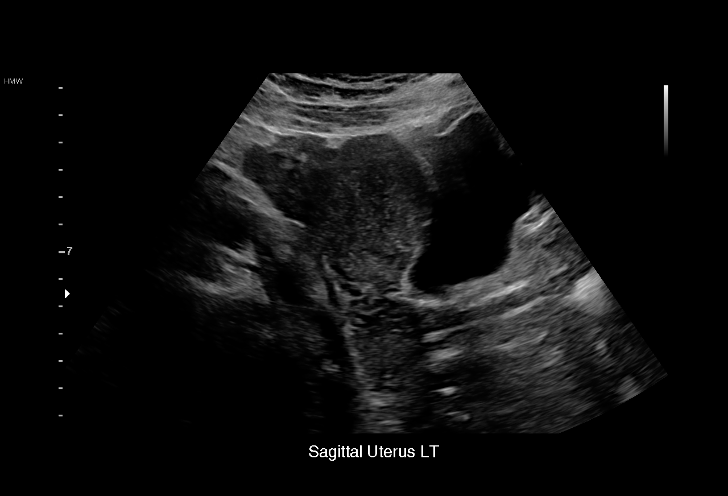
[im 46/83]
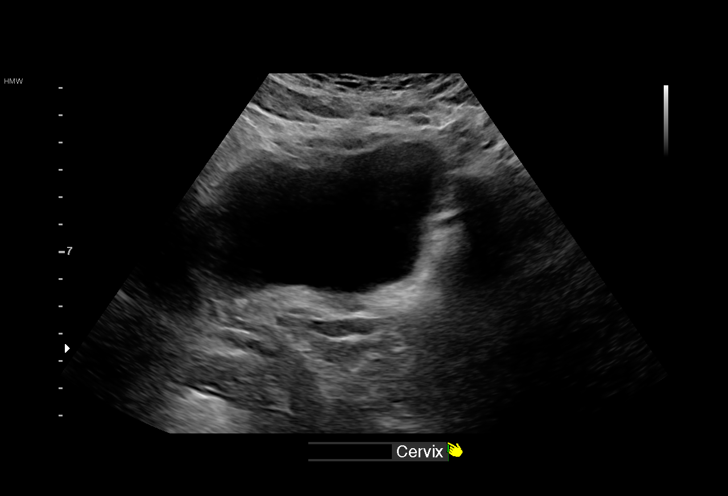
[im 52/83]
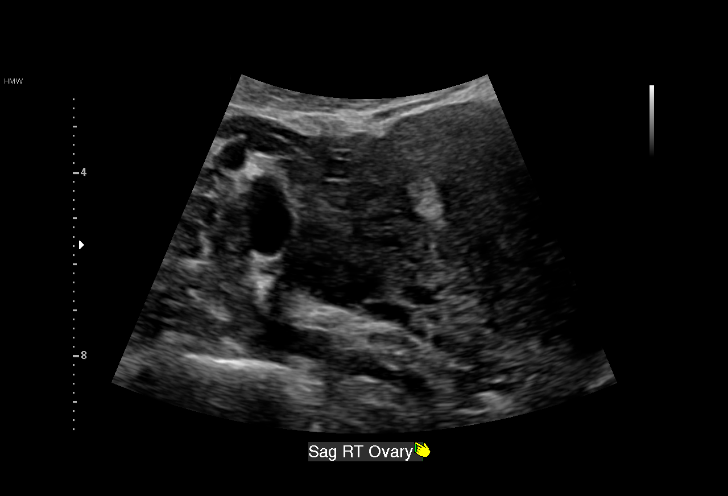
[im 58/83]
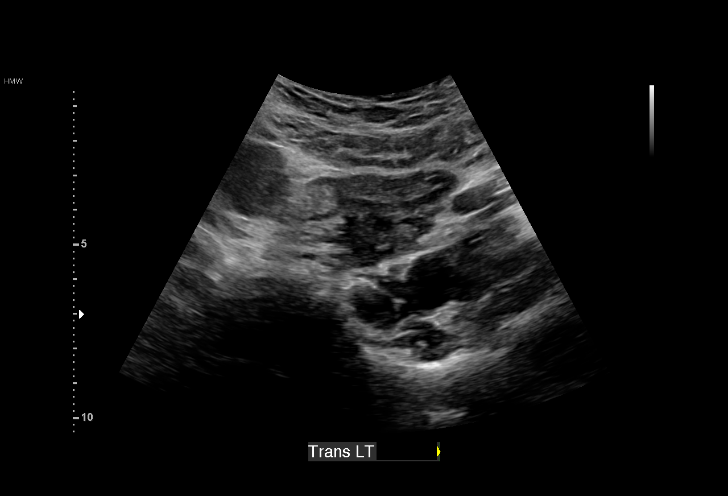
[im 64/83]
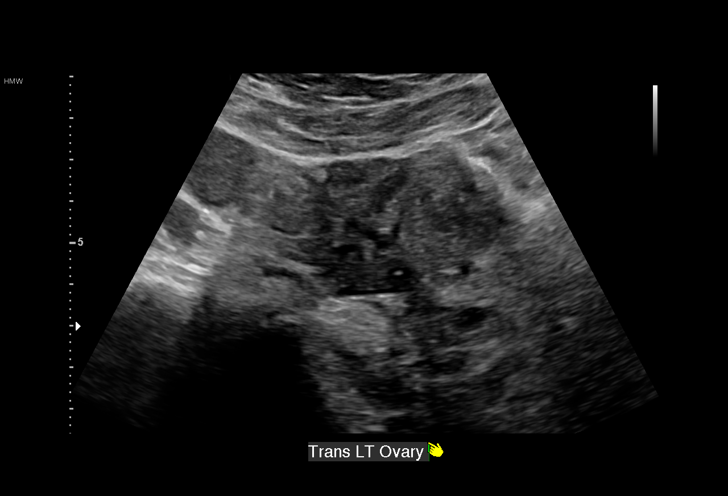
[im 70/83]
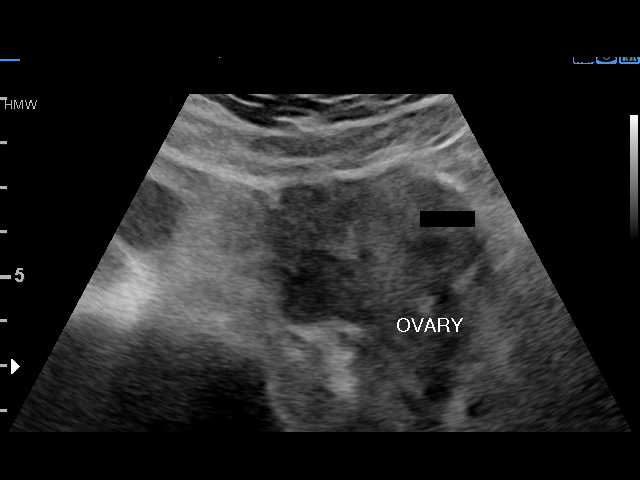
[im 76/83]
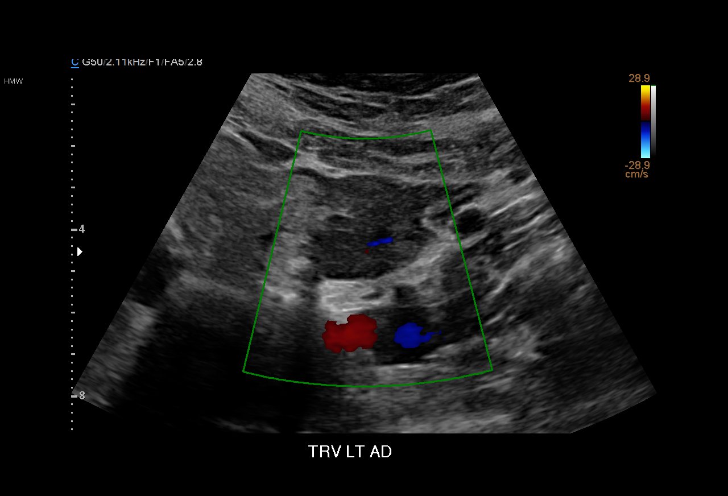
[im 83/83]
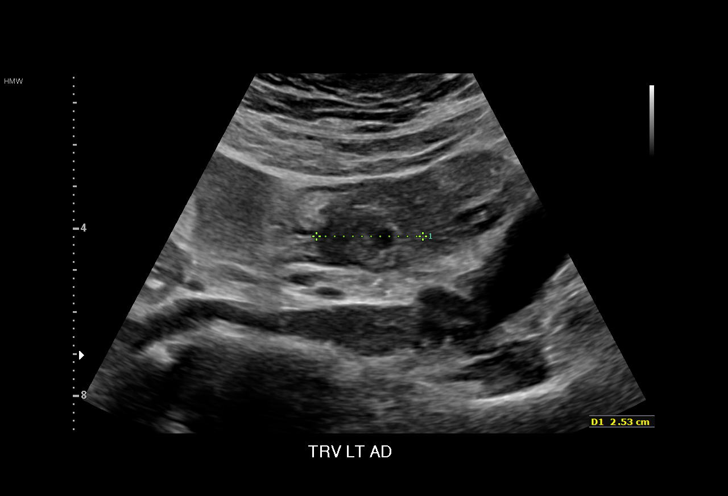

[15 of 28 positions shown; findings below may reference images not displayed]

FINDINGS: Intrauterine gestational sac: Not identified

Yolk sac:  Absent

Embryo:  ABSENT

Subchorionic hemorrhage:  None visualized.

Maternal uterus/adnexae: Normal uterus. NORMAL OVARIES. TRACE FREE
FLUID.
IMPRESSION: No intrauterine gestational sac, yolk sac, or fetal pole identified.
Differential considerations include intrauterine pregnancy too early
to be sonographically visualized, missed abortion, or ectopic
pregnancy. Followup ultrasound is recommended in 10-14 days for
further evaluation.

## 2018-02-19 IMAGING — US US OB TRANSVAGINAL
2 series · 14 of 28 positions shown · non-contrast
Comparison: 02/25/2016 obstetric scan.

CLINICAL DATA: 26-year-old pregnant female with 2 weeks of
intermittent vaginal bleeding. Spotting today. Inappropriate
quantitative beta HCG level rise (864 on 03/02/2016, 860 on
03/01/2016, 651 on 02/29/2016 and 460 on 02/25/2016). Non
localization of the pregnancy on 2 prior obstetric scans, most
recent performed on 02/25/2016.

EDC by LMP: 10/21/2016, projecting to an expected gestational age of
6 weeks 6 days.
EXAM:
TRANSVAGINAL OB ULTRASOUND; OBSTETRIC <14 WK ULTRASOUND
TECHNIQUE: Transvaginal and transabdominal ultrasound was performed for
complete evaluation of the gestation as well as the maternal uterus,
adnexal regions, and pelvic cul-de-sac.

[Series 1: us ob transvaginal · 11 of 50 slices shown (1 of 2)]
[im 3/50]
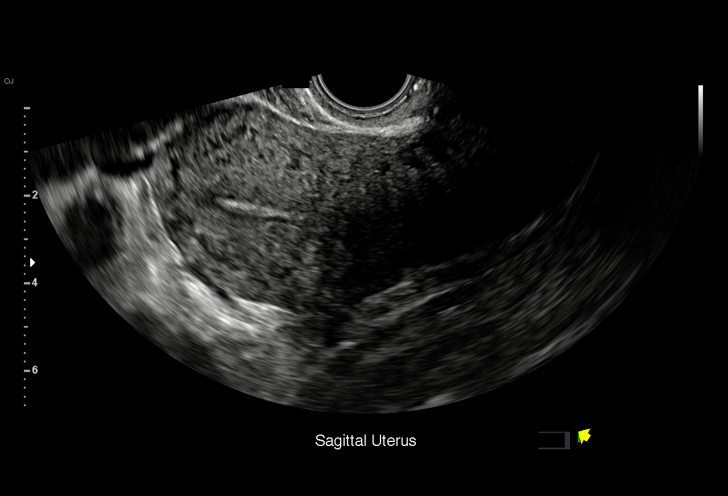
[im 8/50]
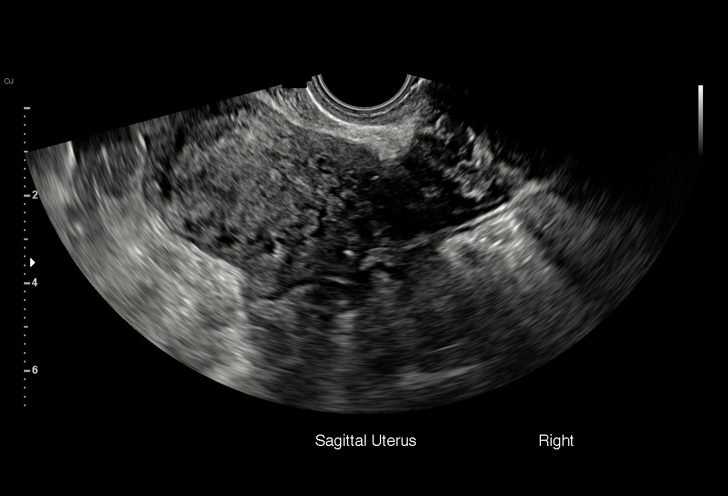
[im 12/50]
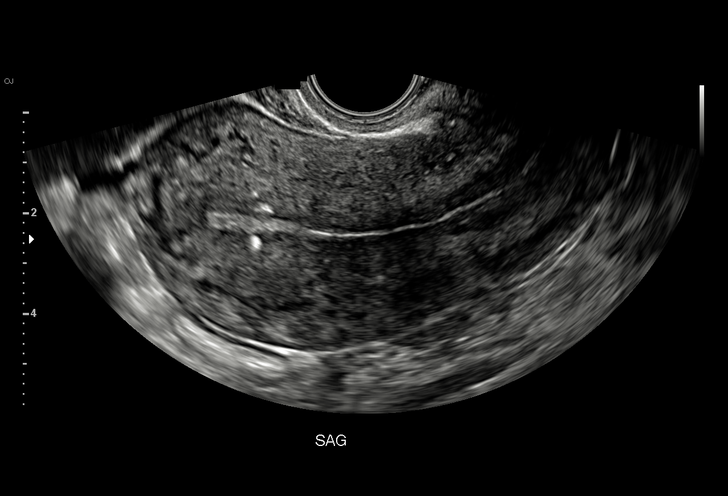
[im 17/50]
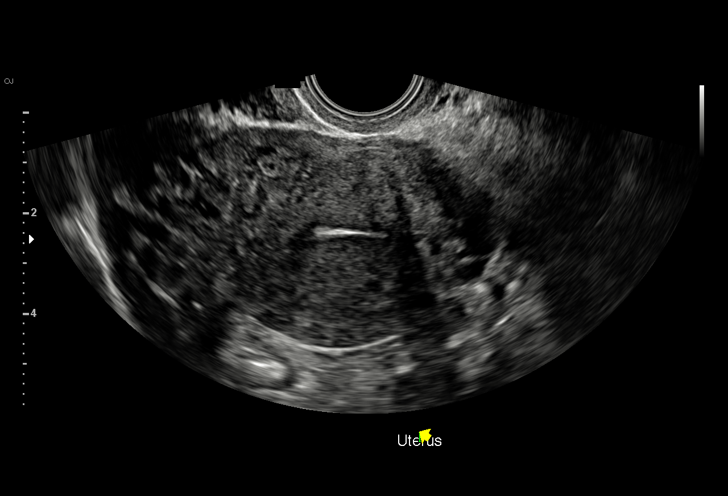
[im 22/50]
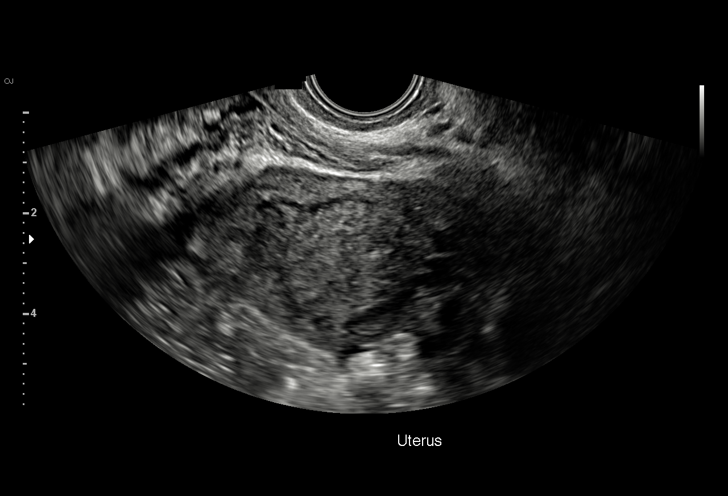
[im 26/50]
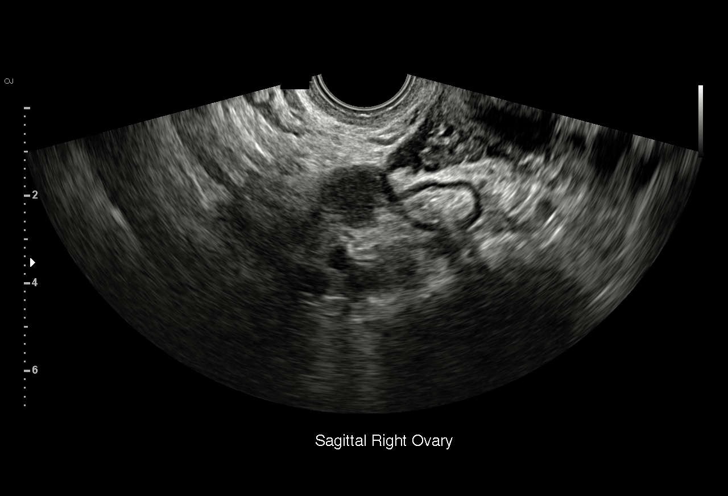
[im 31/50]
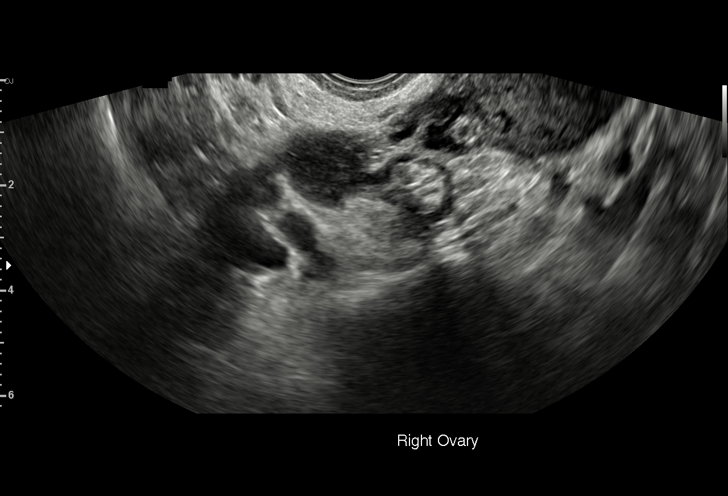
[im 36/50]
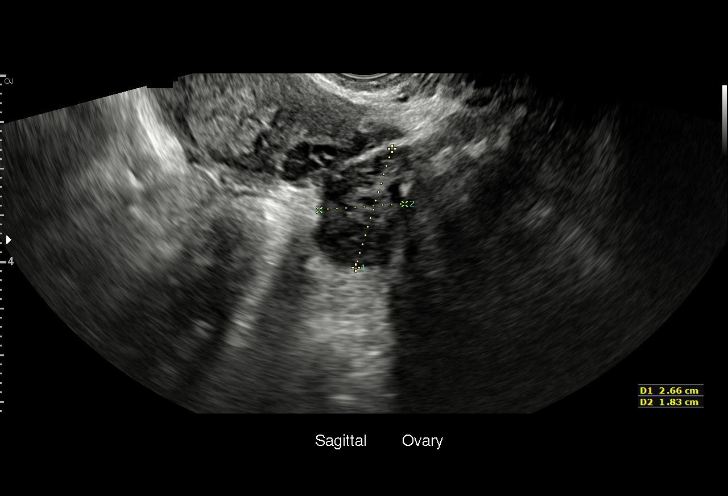
[im 40/50]
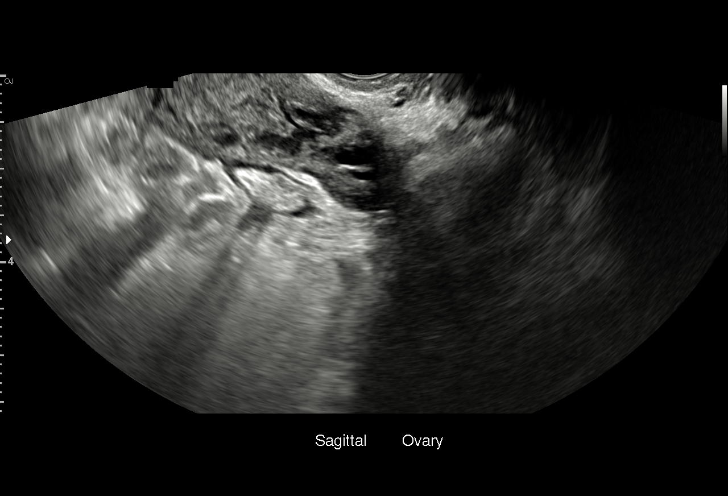
[im 45/50]
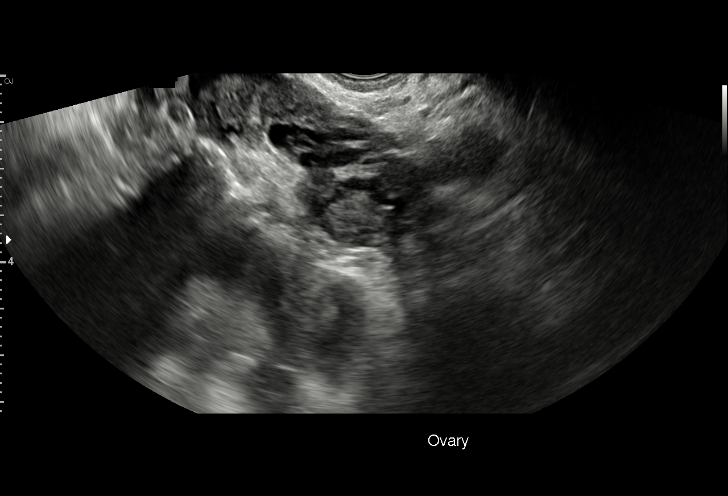
[im 50/50]
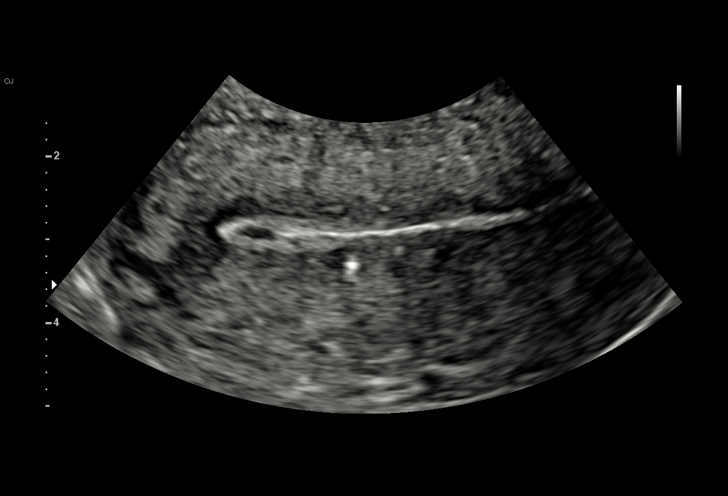

[Series 2: us ob transvaginal · 3 of 15 slices shown (2 of 2)]
[im 3/15]
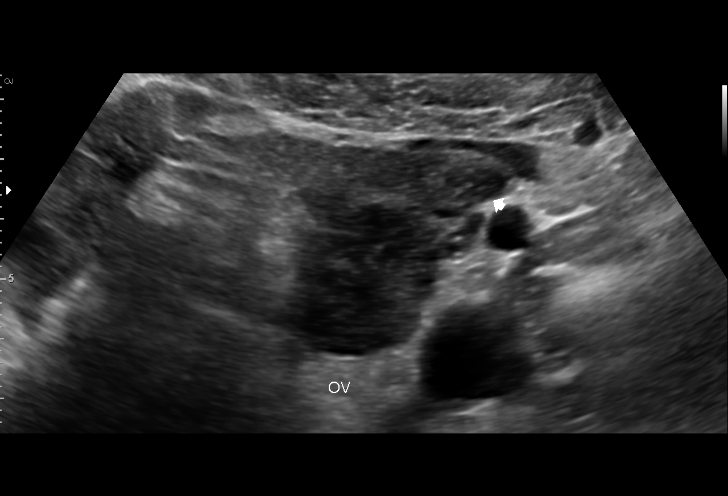
[im 9/15]
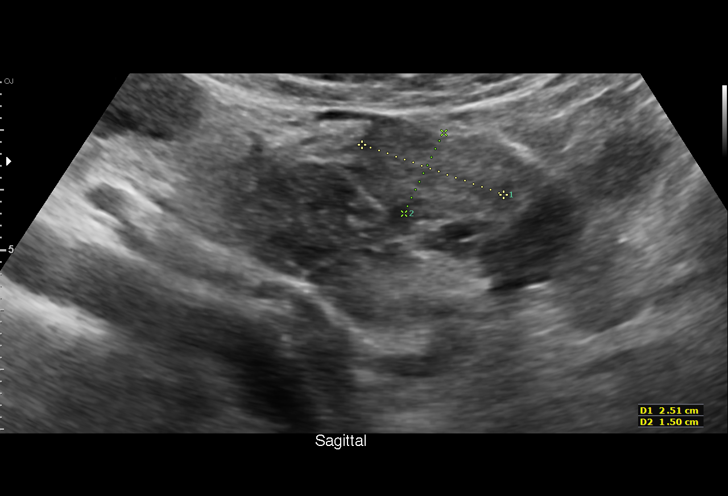
[im 15/15]
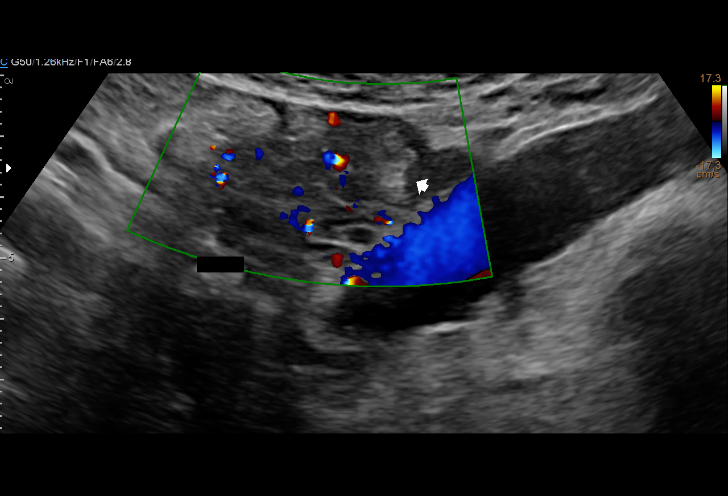

[14 of 28 positions shown; findings below may reference images not displayed]

FINDINGS: Anteverted uterus measures 10.3 x 4.5 x 5.5 cm. No uterine fibroids.
Scattered punctate hyperechoic foci in the subendometrial
myometrium, suggestive of mild diffuse adenomyosis. Bilayer
endometrial thickness 5 mm, decreased from 9 mm on 02/25/2016. Trace
fluid in the endometrial cavity. No intrauterine gestational sac or
focal endometrial mass demonstrated.

Right ovary measures 3.0 x 2.2 x 2.9 cm and appears normal. No right
ovarian or right adnexal masses.

Left ovary measures 2.7 x 1.8 x 1.7 cm and appears normal. On the
transabdominal portion of the study, there is a 2.5 x 1.5 x 2.7 cm
solid-appearing high left adnexal mass located anterior to and
apparently separate from the left ovary. No yolk sac, embryo or
embryonic cardiac activity are demonstrated within this left adnexal
mass. No abnormal free fluid is seen in the pelvis.
IMPRESSION: 1. No intrauterine gestational sac. Bilayer endometrial thickness 5
mm, decreased in the interval. Trace fluid in the endometrial
cavity.
2. Indeterminate solid-appearing 2.5 x 1.5 x 2.7 cm high left
adnexal mass located anterior to and apparently separate from the
left ovary, suspicious for a left tubal ectopic pregnancy.
3. No abnormal free fluid in the pelvis.
These results were called by telephone at the time of interpretation
on 03/03/2016 at [DATE] to Dr. PANG, who verbally
acknowledged these results.
